# Patient Record
Sex: Female | Born: 1935 | Race: White | Hispanic: No | Smoking: Former smoker
Health system: Southern US, Community
[De-identification: ages and names within clinical notes are randomized; demographics above are authoritative.]

## PROBLEM LIST (undated history)

## (undated) DIAGNOSIS — I4891 Unspecified atrial fibrillation: Secondary | ICD-10-CM

## (undated) DIAGNOSIS — I5032 Chronic diastolic (congestive) heart failure: Secondary | ICD-10-CM

## (undated) DIAGNOSIS — I639 Cerebral infarction, unspecified: Secondary | ICD-10-CM

## (undated) DIAGNOSIS — N189 Chronic kidney disease, unspecified: Secondary | ICD-10-CM

## (undated) DIAGNOSIS — E663 Overweight: Secondary | ICD-10-CM

## (undated) DIAGNOSIS — J449 Chronic obstructive pulmonary disease, unspecified: Secondary | ICD-10-CM

## (undated) DIAGNOSIS — I10 Essential (primary) hypertension: Secondary | ICD-10-CM

## (undated) HISTORY — PX: BACK SURGERY: SHX140

## (undated) HISTORY — PX: CHOLECYSTECTOMY: SHX55

## (undated) HISTORY — DX: Cerebral infarction, unspecified: I63.9

## (undated) HISTORY — DX: Chronic obstructive pulmonary disease, unspecified: J44.9

## (undated) HISTORY — DX: Chronic kidney disease, unspecified: N18.9

## (undated) HISTORY — DX: Unspecified atrial fibrillation: I48.91

## (undated) HISTORY — PX: TOTAL ABDOMINAL HYSTERECTOMY W/ BILATERAL SALPINGOOPHORECTOMY: SHX83

## (undated) HISTORY — DX: Chronic diastolic (congestive) heart failure: I50.32

## (undated) HISTORY — DX: Overweight: E66.3

## (undated) HISTORY — DX: Essential (primary) hypertension: I10

---

## 2000-05-19 ENCOUNTER — Inpatient Hospital Stay (HOSPITAL_COMMUNITY): Admission: EM | Admit: 2000-05-19 | Discharge: 2000-05-23 | Payer: Self-pay | Admitting: Emergency Medicine

## 2000-05-23 ENCOUNTER — Inpatient Hospital Stay (HOSPITAL_COMMUNITY)
Admission: RE | Admit: 2000-05-23 | Discharge: 2000-05-27 | Payer: Self-pay | Admitting: Physical Medicine & Rehabilitation

## 2000-09-25 ENCOUNTER — Emergency Department (HOSPITAL_COMMUNITY): Admission: EM | Admit: 2000-09-25 | Discharge: 2000-09-25 | Payer: Self-pay | Admitting: Emergency Medicine

## 2006-06-24 ENCOUNTER — Ambulatory Visit: Payer: Self-pay | Admitting: Cardiology

## 2006-07-13 ENCOUNTER — Ambulatory Visit: Payer: Self-pay | Admitting: Physician Assistant

## 2007-08-14 ENCOUNTER — Encounter: Payer: Self-pay | Admitting: Cardiology

## 2007-10-23 ENCOUNTER — Ambulatory Visit: Payer: Self-pay | Admitting: Cardiology

## 2007-10-23 ENCOUNTER — Encounter: Payer: Self-pay | Admitting: Cardiology

## 2007-11-27 ENCOUNTER — Encounter: Payer: Self-pay | Admitting: Cardiology

## 2008-01-10 ENCOUNTER — Ambulatory Visit: Payer: Self-pay | Admitting: Cardiology

## 2008-02-27 ENCOUNTER — Ambulatory Visit: Payer: Self-pay | Admitting: Cardiology

## 2008-08-08 ENCOUNTER — Encounter: Payer: Self-pay | Admitting: Cardiology

## 2008-08-22 ENCOUNTER — Encounter: Payer: Self-pay | Admitting: Cardiology

## 2008-11-04 ENCOUNTER — Ambulatory Visit: Payer: Self-pay | Admitting: Cardiology

## 2008-11-04 ENCOUNTER — Encounter: Payer: Self-pay | Admitting: Physician Assistant

## 2008-12-23 DIAGNOSIS — E663 Overweight: Secondary | ICD-10-CM | POA: Insufficient documentation

## 2008-12-23 DIAGNOSIS — I10 Essential (primary) hypertension: Secondary | ICD-10-CM | POA: Insufficient documentation

## 2008-12-23 DIAGNOSIS — I5032 Chronic diastolic (congestive) heart failure: Secondary | ICD-10-CM

## 2008-12-23 DIAGNOSIS — I4891 Unspecified atrial fibrillation: Secondary | ICD-10-CM | POA: Insufficient documentation

## 2009-01-20 ENCOUNTER — Encounter: Payer: Self-pay | Admitting: Cardiology

## 2009-01-20 ENCOUNTER — Emergency Department (HOSPITAL_COMMUNITY): Admission: EM | Admit: 2009-01-20 | Discharge: 2009-01-20 | Payer: Self-pay | Admitting: Emergency Medicine

## 2009-04-10 ENCOUNTER — Encounter: Payer: Self-pay | Admitting: Cardiology

## 2009-06-09 ENCOUNTER — Ambulatory Visit: Payer: Self-pay | Admitting: Cardiology

## 2009-06-09 DIAGNOSIS — L659 Nonscarring hair loss, unspecified: Secondary | ICD-10-CM | POA: Insufficient documentation

## 2009-06-09 DIAGNOSIS — R609 Edema, unspecified: Secondary | ICD-10-CM

## 2009-06-20 ENCOUNTER — Encounter: Payer: Self-pay | Admitting: Cardiology

## 2009-06-22 ENCOUNTER — Encounter: Payer: Self-pay | Admitting: Cardiology

## 2009-06-24 ENCOUNTER — Encounter: Payer: Self-pay | Admitting: Cardiology

## 2009-06-25 ENCOUNTER — Encounter: Payer: Self-pay | Admitting: Cardiology

## 2009-07-29 ENCOUNTER — Encounter: Payer: Self-pay | Admitting: Cardiology

## 2009-10-06 ENCOUNTER — Ambulatory Visit: Payer: Self-pay | Admitting: Cardiology

## 2009-10-06 DIAGNOSIS — R0602 Shortness of breath: Secondary | ICD-10-CM

## 2009-10-06 DIAGNOSIS — I951 Orthostatic hypotension: Secondary | ICD-10-CM

## 2009-10-08 ENCOUNTER — Encounter: Payer: Self-pay | Admitting: Cardiology

## 2009-10-09 ENCOUNTER — Ambulatory Visit: Payer: Self-pay | Admitting: Cardiology

## 2009-10-16 ENCOUNTER — Telehealth (INDEPENDENT_AMBULATORY_CARE_PROVIDER_SITE_OTHER): Payer: Self-pay | Admitting: *Deleted

## 2009-10-22 ENCOUNTER — Telehealth (INDEPENDENT_AMBULATORY_CARE_PROVIDER_SITE_OTHER): Payer: Self-pay | Admitting: *Deleted

## 2009-10-30 ENCOUNTER — Ambulatory Visit: Payer: Self-pay | Admitting: Cardiology

## 2009-10-30 DIAGNOSIS — R5383 Other fatigue: Secondary | ICD-10-CM

## 2009-10-30 DIAGNOSIS — R5381 Other malaise: Secondary | ICD-10-CM | POA: Insufficient documentation

## 2009-10-31 ENCOUNTER — Encounter: Payer: Self-pay | Admitting: Cardiology

## 2009-11-01 ENCOUNTER — Encounter: Payer: Self-pay | Admitting: Cardiology

## 2009-11-07 ENCOUNTER — Encounter: Payer: Self-pay | Admitting: Cardiology

## 2009-11-07 ENCOUNTER — Encounter: Payer: Self-pay | Admitting: Physician Assistant

## 2009-11-08 ENCOUNTER — Ambulatory Visit: Payer: Self-pay | Admitting: Internal Medicine

## 2009-11-08 ENCOUNTER — Inpatient Hospital Stay (HOSPITAL_COMMUNITY): Admission: EM | Admit: 2009-11-08 | Discharge: 2009-11-20 | Payer: Self-pay | Admitting: Emergency Medicine

## 2009-11-08 ENCOUNTER — Ambulatory Visit: Payer: Self-pay | Admitting: Cardiology

## 2009-11-09 ENCOUNTER — Encounter: Payer: Self-pay | Admitting: Internal Medicine

## 2009-11-09 ENCOUNTER — Ambulatory Visit: Payer: Self-pay | Admitting: Vascular Surgery

## 2009-11-12 ENCOUNTER — Encounter (INDEPENDENT_AMBULATORY_CARE_PROVIDER_SITE_OTHER): Payer: Self-pay | Admitting: *Deleted

## 2010-02-26 ENCOUNTER — Ambulatory Visit: Payer: Self-pay | Admitting: Cardiology

## 2010-02-26 DIAGNOSIS — G603 Idiopathic progressive neuropathy: Secondary | ICD-10-CM | POA: Insufficient documentation

## 2010-03-02 ENCOUNTER — Encounter: Payer: Self-pay | Admitting: Cardiology

## 2010-03-03 ENCOUNTER — Encounter: Payer: Self-pay | Admitting: Cardiology

## 2010-03-06 ENCOUNTER — Encounter (INDEPENDENT_AMBULATORY_CARE_PROVIDER_SITE_OTHER): Payer: Self-pay | Admitting: *Deleted

## 2010-03-10 ENCOUNTER — Telehealth (INDEPENDENT_AMBULATORY_CARE_PROVIDER_SITE_OTHER): Payer: Self-pay | Admitting: *Deleted

## 2010-05-12 NOTE — Assessment & Plan Note (Signed)
Summary: 3 MO FU PER JUNE REMINDER-SRS   Visit Type:  Follow-up Primary Provider:  Dr. Cato Mulligan   History of Present Illness: the patient is a 77 year old female with history of permanent fibrillation. She has no significant coronary artery disease. She has a history of diastolic heart failure and a negative dobutamine stress echo in 1998. She also has chronic renal insufficiency. The patient reports significant dizziness with orthostatic symptoms.  The patient is questioning to be switched to dabigatran. However her creatinine is 1.7 with a GFR of 30 mL's per minute which would increase the risk of bleeding significantly on dabigatran do to its unpredictable anticoagulation level at this level of GFR. Also the patient has been stable her Coumadin with no adverse events.  The patient's main complaint is weakness and feeling like she got a fall when she gets up too quickly. She also complains of multiple aches and pains. According to her daughter she is very depressed because she felt very functional anymore.  Currently she also sometimes will follow along and is followed by Dr. Leandrew Koyanagi.  We did orthostatics in the office today and the patient was markedly orthostatic with a drop in her systolic blood pressure to 69 mmHg associated complaints of dizziness.  Preventive Screening-Counseling & Management  Alcohol-Tobacco     Smoking Status: quit     Year Quit: 1970  Current Medications (verified): 1)  Diltiazem Hcl Coated Beads 240 Mg Xr24h-Cap (Diltiazem Hcl Coated Beads) .... Take 1 Tablet By Mouth Once A Day 2)  Metoprolol Tartrate 50 Mg Tabs (Metoprolol Tartrate) .... 1/2 Tablet Twice A Day 3)  Trazodone Hcl 100 Mg Tabs (Trazodone Hcl) .... Take 1 Tablet By Mouth Once A Day 4)  Allopurinol 300 Mg Tabs (Allopurinol) .... Take 1 Tablet By Mouth Once A Day 5)  Potassium Chloride Cr 10 Meq Cr-Caps (Potassium Chloride) .... Take 1 Tablet By Mouth Two Times A Day 6)  Meclizine Hcl 25 Mg  Tabs (Meclizine Hcl) .... Take 1/2 To 1 Tablet Three Times A Day As Needed 7)  Meloxicam 7.5 Mg Tabs (Meloxicam) .... Take 1 Tablet By Mouth Two Times A Day 8)  Furosemide 40 Mg Tabs (Furosemide) .... Take 1 Tablet By Mouth Once A Day 9)  Warfarin Sodium 5 Mg Tabs (Warfarin Sodium) .... Use As Directed By Anticoagulation Clinic 10)  Diclofenac Sodium 75 Mg Tbec (Diclofenac Sodium) .... Take 1 Tablet By Mouth Two Times A Day As Needed 11)  Calcium Carbonate-Vitamin D 600-400 Mg-Unit Tabs (Calcium Carbonate-Vitamin D) .... Take 1 Tablet By Mouth Once A Day 12)  Percocet 5-325 Mg Tabs (Oxycodone-Acetaminophen) .... Take 1 Tablet By Mouth Two Times A Day As Needed  Allergies (verified): 1)  Prednisone (Prednisone) 2)  Sulfamethoxazole (Sulfamethoxazole)  Comments:  Nurse/Medical Assistant: The patient's medication bottles and allergies were reviewed with the patient and were updated in the Medication and Allergy Lists.  Past History:  Past Medical History: Last updated: 12/23/2008 HYPERTENSION, UNSPECIFIED (ICD-401.9) OVERWEIGHT/OBESITY (ICD-278.02) DIASTOLIC HEART FAILURE, CHRONIC (ICD-428.32) ATRIAL FIBRILLATION (ICD-427.31) COPD Chronic renal insufficiency History of stroke  Past Surgical History: Last updated: 12/23/2008 Back Surgery Cholecystectomy TAH and BSO  Family History: Last updated: 12/23/2008 Family History of Coronary Artery Disease:  Family History of CVA or Stroke:   Social History: Last updated: 12/23/2008 Single  Tobacco Use - Former.  Alcohol Use - no  Risk Factors: Smoking Status: quit (10/06/2009)  Social History: Smoking Status:  quit  Review of Systems  The patient complains of fatigue, shortness of breath, muscle weakness, and dizziness.  The patient denies malaise, fever, weight gain/loss, vision loss, decreased hearing, hoarseness, chest pain, palpitations, prolonged cough, wheezing, sleep apnea, coughing up blood, abdominal pain,  blood in stool, nausea, vomiting, diarrhea, heartburn, incontinence, blood in urine, joint pain, rash, skin lesions, headache, fainting, depression, anxiety, enlarged lymph nodes, easy bruising or bleeding, and environmental allergies.    Vital Signs:  Patient profile:   77 year old female Height:      67.5 inches Weight:      259 pounds Pulse rate:   62 / minute Pulse (ortho):   74 / minute BP sitting:   105 / 68  (left arm) BP standing:   91 / 65 Cuff size:   large  Vitals Entered By: Carlye Grippe (October 06, 2009 9:24 AM)  Serial Vital Signs/Assessments:  Time      Position  BP       Pulse  Resp  Temp     By 10:34 AM  Lying RA  108/71   73                    Lydia Anderson 10:34 AM  Sitting   62/35    64                    Lydia Anderson 10:34 AM  Standing  91/65    74                    Lydia Anderson 10:37 AM  Standing  111/69   80                    Lydia Anderson 10:40 AM  Standing  107/70   73                    Carlye Grippe   Physical Exam  Additional Exam:  General: Well-developed, well-nourished in no distress head: Normocephalic and atraumatic eyes PERRLA/EOMI intact, conjunctiva and lids normal nose: No deformity or lesions mouth normal dentition, normal posterior pharynx neck: Supple, no JVD.  No masses, thyromegaly or abnormal cervical nodes lungs: Normal breath sounds bilaterally without wheezing.  Normal percussion heart: irregular rate and rhythm with normal S1 and S2, no S3 or S4.  PMI is normal.  No pathological murmurs abdomen: Normal bowel sounds, abdomen is soft and nontender without masses, organomegaly or hernias noted.  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 I asked her to decrease Lasix to 40 mm p.o. q. daily and to discontinue Diovan.  Problem # 2:  COUMADIN THERAPY (ICD-V58.61) the patient should continue on Coumadin therapy. I do not think it's safe for her to switch to dabigatran with a GFR of 30 mL per minute.  Problem # 3:  DIASTOLIC HEART FAILURE, CHRONIC (ICD-428.32) the patient has no evidence of volume overload. Continue current medical therapy adjustment in Lasix because of orthostatic hypotension The following medications were removed from the medication list:    Diovan 160 Mg Tabs (Valsartan) .Marland Kitchen... Take one tablet by mouth daily Her updated medication list for  this problem includes:    Diltiazem Hcl Coated Beads 240 Mg Xr24h-cap (Diltiazem hcl coated beads) .Marland Kitchen... Take 1 tablet by mouth once a day    Metoprolol Tartrate 50 Mg Tabs (Metoprolol tartrate) .Marland Kitchen... 1/2 tablet twice a day    Furosemide 40 Mg Tabs (Furosemide) .Marland Kitchen... Take 1 tablet by mouth once a day    Warfarin Sodium 5 Mg Tabs (Warfarin sodium) ..... Use as directed by anticoagulation clinic  Problem # 4:  ATRIAL FIBRILLATION (ICD-427.31) chronic atrial fibrillation which appeared to be well rate controlled. Her updated medication list for this problem includes:    Metoprolol Tartrate 50 Mg Tabs (Metoprolol tartrate) .Marland Kitchen... 1/2 tablet twice a day    Warfarin Sodium 5 Mg Tabs (Warfarin sodium) ..... Use as directed by anticoagulation clinic  Other Orders: 2-D Echocardiogram (2D Echo)  Patient Instructions: 1)  2D Echo  2)  Stop Diovan 3)  Decrease Lasix to 40mg  daily 4)  Not a candidtate for Pradaxa due to elevated kidney function 5)  Follow up in  6 months

## 2010-05-12 NOTE — Assessment & Plan Note (Signed)
Summary: 6 MO FU PER JAN REMINDER-SRS   Visit Type:  Follow-up Primary Provider:  Dr. Cato Mulligan  CC:  follow-up visit.  History of Present Illness: the patient is a 77 year old female with a history of permanent atrial fibrillation. The patient has no significant coronary artery disease. She has a history from and diastolic heart failure and a negative dobutamine stress echo in 1998. She reportedly also has chronic renal insufficiency.  The patient's main complaint today is that she reports a swishing in her ear at various times particularly at night. She does not feel that her heart rate is irregular. She also has not taken her blood pressure and during that time. The patient also has switched doctors now to Dr. Leandrew Koyanagi. She has been complaining alopecia and brittle nails.she also reports increased lower extremity edema. She attributes her alopeciat due to the increased dose of Cardizem.  Procardia vascular perspective she denies however any chest pain shortness of breath orthopnea PND. She denies any syncope.  Clinical Review Panels:  CXR CXR results The cardiopericardial silhouette is enlarged.  Mild         interstitial changes appear chronic.  No focal airspace disease is         present.  The visualized soft tissues and bony thorax are         unremarkable.                   IMPRESSION:         1.  Cardiomegaly without failure.         2.  Stable appearance of chronic interstitial changes and COPD. (10/23/2007)  Echocardiogram Echocardiogram 1. The left ventricular chamber size is normal. Mild concentric left         ventricular hypertrophy is observed. There is normal left ventricular         systolic function. The estimated ejection fraction is 60-65%.          2. The left atrium is mild to moderately dilated. There is a trace of         mitral regurgitation.         3. The aortic valve is trileaflet. Mild aortic leaflet calcification         is visualized. Systolic  excursion of the aortic valve is normal. There         is aortic annular calcification. There is no evidence of aortic         regurgitation.         4. The right ventricle is mildly dilated. The right ventricular global         systolic function is normal.          5. There is mild tricuspid regurgitation.The right ventricular         systolic pressure is calculated at 41 mmHg.          6. There is a small pericardial effusion. (10/24/2007)    Preventive Screening-Counseling & Management  Alcohol-Tobacco     Smoking Status: quit > 6 months  Comments: Quit smoking 18-19 yrs ago after smoking for about 30 yrs  Current Medications (verified): 1)  Diltiazem Hcl Coated Beads 240 Mg Xr24h-Cap (Diltiazem Hcl Coated Beads) .... Take 1 Tablet By Mouth Once A Day 2)  Metoprolol Tartrate 50 Mg Tabs (Metoprolol Tartrate) .... 1/2 Tablet Twice A Day 3)  Trazodone Hcl 50 Mg Tabs (Trazodone Hcl) .... Take 1/2 To 1 Tablet By Mouth At Bedtime 4)  Allopurinol 300 Mg Tabs (Allopurinol) .... Take 1 Tablet By Mouth Once A Day 5)  Potassium Chloride Cr 10 Meq Cr-Caps (Potassium Chloride) .... Take One Tablet By Mouth Every Other Day 6)  Meclizine Hcl 25 Mg Tabs (Meclizine Hcl) .... Take 1/2 To 1 Tablet As Needed 7)  Meloxicam 7.5 Mg Tabs (Meloxicam) .... Take 1 Tablet By Mouth Two Times A Day 8)  Furosemide 40 Mg Tabs (Furosemide) .... Take 1 1/2 Tablet By Mouth Once Daily 9)  Warfarin Sodium 5 Mg Tabs (Warfarin Sodium) .... Use As Directed By Anticoagulation Clinic 10)  Diovan 160 Mg Tabs (Valsartan) .... Take One Tablet By Mouth Daily 11)  Diclofenac Sodium 75 Mg Tbec (Diclofenac Sodium) .... Take 1 Tablet By Mouth Two Times A Day As Needed 12)  Calcium Carbonate-Vitamin D 600-400 Mg-Unit Tabs (Calcium Carbonate-Vitamin D) .... Take 1 Tablet By Mouth Once A Day  Allergies: 1)  Prednisone (Prednisone) 2)  Sulfamethoxazole (Sulfamethoxazole)  Comments:  Nurse/Medical Assistant: The patient's  medications were reviewed with the patient and were updated in the Medication List. Pt brought medication bottles to office visit.  Cyril Loosen, RN, BSN (June 09, 2009 11:20 AM)  Past History:  Past Medical History: Last updated: 12/23/2008 HYPERTENSION, UNSPECIFIED (ICD-401.9) OVERWEIGHT/OBESITY (ICD-278.02) DIASTOLIC HEART FAILURE, CHRONIC (ICD-428.32) ATRIAL FIBRILLATION (ICD-427.31) COPD Chronic renal insufficiency History of stroke  Past Surgical History: Last updated: 12/23/2008 Back Surgery Cholecystectomy TAH and BSO  Family History: Last updated: 12/23/2008 Family History of Coronary Artery Disease:  Family History of CVA or Stroke:   Social History: Last updated: 12/23/2008 Single  Tobacco Use - Former.  Alcohol Use - no  Risk Factors: Smoking Status: quit > 6 months (06/09/2009)  Social History: Smoking Status:  quit > 6 months  Review of Systems       The patient complains of fatigue.  The patient denies malaise, fever, weight gain/loss, vision loss, decreased hearing, hoarseness, chest pain, palpitations, shortness of breath, prolonged cough, wheezing, sleep apnea, coughing up blood, abdominal pain, blood in stool, nausea, vomiting, diarrhea, heartburn, incontinence, blood in urine, muscle weakness, joint pain, leg swelling, rash, skin lesions, headache, fainting, dizziness, depression, anxiety, enlarged lymph nodes, easy bruising or bleeding, and environmental allergies.    Vital Signs:  Patient profile:   77 year old female Height:      67.5 inches Weight:      269 pounds BMI:     41.66 Pulse rate:   89 / minute BP sitting:   133 / 79  (left arm) Cuff size:   large  Vitals Entered By: Cyril Loosen, RN, BSN (June 09, 2009 11:07 AM)  Nutrition Counseling: Patient's BMI is greater than 25 and therefore counseled on weight management options. CC: follow-up visit Comments Pt states yesterday evening she got upset and can hear a whooshing  in her ear   Physical Exam  Additional Exam:  General: Well-developed, well-nourished in no distress head: Normocephalic and atraumatic eyes PERRLA/EOMI intact, conjunctiva and lids normal nose: No deformity or lesions mouth normal dentition, normal posterior pharynx neck: Supple, no JVD.  No masses, thyromegaly or abnormal cervical nodes lungs: Normal breath sounds bilaterally without wheezing.  Normal percussion heart: irregular rate and rhythm with normal S1 and S2, no S3 or S4.  PMI is normal.  No pathological murmurs abdomen: Normal bowel sounds, abdomen is soft and nontender without masses, organomegaly or hernias noted.  No hepatosplenomegaly musculoskeletal: Back normal, normal gait muscle strength and tone normal pulsus: Pulse is normal  in all 4 extremities Extremities:3+ peripheral pitting edema neurologic: Alert and oriented x 3 skin: Intact without lesions or rashes cervical nodes: No significant adenopathy psychologic: Normal affect    Impression & Recommendations:  Problem # 1:  ATRIAL FIBRILLATION (ICD-427.31) the patient is in permanent atrial fibrillation. Her heart rate is well controlled. She wants to decrease her diltiazem because she feels higher doses associated with alopecia. I changed the patient's diltiazem CD to 240 mg p.o. q. daily. I do not think this will cause worse rate control. Her updated medication list for this problem includes:    Metoprolol Tartrate 50 Mg Tabs (Metoprolol tartrate) .Marland Kitchen... 1/2 tablet twice a day    Warfarin Sodium 5 Mg Tabs (Warfarin sodium) ..... Use as directed by anticoagulation clinic  Orders: T-BNP  (B Natriuretic Peptide) [phone]) T-Basic Metabolic Panel ([id number]) T-TSH [phone])  Problem # 2:  DIASTOLIC HEART FAILURE, CHRONIC (ICD-428.32) there is potential evidence of volume overload with significant lower extremity edema. I checked a BNP level, although this is possible it is related to a calcium channel  blocker use. The following medications were removed from the medication list:    Diltiazem Hcl Coated Beads 300 Mg Xr24h-cap (Diltiazem hcl coated beads) .Marland Kitchen... Take 1 capsule by mouth once a day Her updated medication list for this problem includes:    Diltiazem Hcl Coated Beads 240 Mg Xr24h-cap (Diltiazem hcl coated beads) .Marland Kitchen... Take 1 tablet by mouth once a day    Metoprolol Tartrate 50 Mg Tabs (Metoprolol tartrate) .Marland Kitchen... 1/2 tablet twice a day    Furosemide 40 Mg Tabs (Furosemide) .Marland Kitchen... Take 1 1/2 tablet by mouth once daily    Warfarin Sodium 5 Mg Tabs (Warfarin sodium) ..... Use as directed by anticoagulation clinic    Diovan 160 Mg Tabs (Valsartan) .Marland Kitchen... Take one tablet by mouth daily  Orders: T-BNP  (B Natriuretic Peptide) [phone]) T-Basic Metabolic Panel ([id number]) T-TSH ([id number])  Problem # 3:  HYPERTENSION, UNSPECIFIED (ICD-401.9) Assessment: Improved  The following medications were removed from the medication list:    Diltiazem Hcl Coated Beads 300 Mg Xr24h-cap (Diltiazem hcl coated beads) .Marland Kitchen... Take 1 capsule by mouth once a day Her updated medication list for this problem includes:    Diltiazem Hcl Coated Beads 240 Mg Xr24h-cap (Diltiazem hcl coated beads) .Marland Kitchen... Take 1 tablet by mouth once a day    Metoprolol Tartrate 50 Mg Tabs (Metoprolol tartrate) .Marland Kitchen... 1/2 tablet twice a day    Furosemide 40 Mg Tabs (Furosemide) .Marland Kitchen... Take 1 1/2 tablet by mouth once daily    Diovan 160 Mg Tabs (Valsartan) .Marland Kitchen... Take one tablet by mouth daily  Orders: T-BNP  (B Natriuretic Peptide) [phone]) T-Basic Metabolic Panel ([id number]) T-TSH ([id number])  Problem # 4:  ALOPECIA (ICD-704.00) I told the patient the most likely culprit agent his Coumadin. We will also check her TSH. I decreased her dose of diltiazem. However this remains a problem I do think it is worthwhile to change her from Coumadin to dabigatran. We will further discuss the risk and benefits of this  medication change her next clinic visit.  Problem # 5:  EDEMA (ICD-782.3) the patient has significant lower extremity edema. This could be related to her calcium channel blocker. However suspect the patient has diastolic heart failure. We will check a BNP level and we may need to increase her Lasix. I also checked a BMET at the same time.  Patient Instructions: 1)  Labs today at [facility] 2)  Decrease Diltiazem CD to  240mg  daily Prescriptions: DILTIAZEM HCL COATED BEADS 240 MG XR24H-CAP (DILTIAZEM HCL COATED BEADS) Take 1 tablet by mouth once a day  #30 x 6   Entered by:   Hoover Brunette, LPN   Authorized by:   Lewayne Bunting, MD, [facility]   Signed by:   Hoover Brunette, LPN on 62/95/2841   Method used:   Electronically to        [facility] [address] (retail)       [address]       [place], [place]  [address]       Ph: [phone] or [phone]       Fax: [phone]   RxID:   [id number]

## 2010-05-12 NOTE — Miscellaneous (Signed)
Summary: Orders Update  Clinical Lists Changes  Orders: Added new Referral order of Neurology Referral (Neuro) - Signed 

## 2010-05-12 NOTE — Procedures (Signed)
Summary: Holter and Event/ [facility] END OF SERVICE SUMMARY REPORT  Holter and Event/ [facility] END OF SERVICE SUMMARY REPORT   Imported By: Dorise Hiss 06/06/2009 15:03:11  _____________________________________________________________________  External Attachment:    Type:   Image     Comment:   External Document

## 2010-05-12 NOTE — Assessment & Plan Note (Signed)
Summary: return office visit  --agh   Visit Type:  hospital follow-up Primary Provider:  Dr. Cato Mulligan   History of Present Illness: the patient is a 77 year old female with history of permanent fibrillation. She has no significant coronary artery disease. She has a history of diastolic heart failure and a negative dobutamine stress echo in 1998. She also has chronic renal insufficiency. The patient reports significant dizziness with orthostatic symptoms.  The patient is questioning to be switched to dabigatran. However her creatinine is 1.7 with a GFR of 30 mL's per minute which would increase the risk of bleeding significantly on dabigatran do to its unpredictable anticoagulation level at this level of GFR. Also the patient has been stable her Coumadin with no adverse events.  The patient's main complaint is weakness and feeling like she got a fall when she gets up too quickly. She also complains of multiple aches and pains. According to her daughter she is very depressed because she felt very functional anymore.  The patient was hospitalized for her sudden hypotension associated with fall. We decreased her Lasix at that time. Plans wereto further evaluate her with aldosterone, cortisolso and serum norepinephrine levels. Unfortunately she was not done in the hospital. The patient now reports increased weakness. I tried to understand and she has no strength in her lower extremities. She did unable to do any type of squatting without assistance. She also reports when pains in both knees. She spending most of the time in a wheelchair. Her daughter is very concerned and feels that her mother has deteriorated rapidly in the last couple months.  Preventive Screening-Counseling & Management  Alcohol-Tobacco     Smoking Status: quit     Year Quit: 1970  Current Medications (verified): 1)  Diltiazem Hcl Coated Beads 240 Mg Xr24h-Cap (Diltiazem Hcl Coated Beads) .... Take 1 Tablet By Mouth Once A  Day 2)  Metoprolol Tartrate 50 Mg Tabs (Metoprolol Tartrate) .... 1/2 Tablet Twice A Day 3)  Trazodone Hcl 100 Mg Tabs (Trazodone Hcl) .... Take 1 Tablet By Mouth Once A Day 4)  Allopurinol 300 Mg Tabs (Allopurinol) .... Take 1 Tablet By Mouth Once A Day 5)  Potassium Chloride Cr 10 Meq Cr-Caps (Potassium Chloride) .... Take One By Mouth Every Other Day 6)  Meclizine Hcl 25 Mg Tabs (Meclizine Hcl) .... Take 1 Tablet Three Times A Day As Needed 7)  Coumadin 10 Mg Tabs (Warfarin Sodium) .... Take 1 Tablet By Mouth Once A Day 8)  Oscal 500/200 D-3 500-200 Mg-Unit Tabs (Calcium-Vitamin D) .... Take 1 Tablet By Mouth Two Times A Day 9)  Macrobid 100 Mg Caps (Nitrofurantoin Monohyd Macro) .... Take 1 Tablet By Mouth Two Times A Day X's 7 Days 10)  Robitussin Dm 100-10 Mg/76ml Syrp (Dextromethorphan-Guaifenesin) .... 5cc By Mouth Everys 6 Hours 11)  Fludrocortisone Acetate 0.1 Mg Tabs (Fludrocortisone Acetate) .... Take 1 Tablet By Mouth Once A Day 12)  Multivitamins  Tabs (Multiple Vitamin) .... Take 1 Tablet By Mouth Once A Day 13)  Vitamin D3 1000 Unit Tabs (Cholecalciferol) .... Take 1 Tablet By Mouth Once A Day 14)  Topamax 25 Mg Tabs (Topiramate) .... Take 1 Tablet By Mouth Two Times A Day 15)  Prozac 20 Mg Caps (Fluoxetine Hcl) .... Take 1 Tablet By Mouth Once A Day 16)  Hydrocodone-Acetaminophen 5-325 Mg Tabs (Hydrocodone-Acetaminophen) .... Take 1 Tablet By Mouth Three Times A Day As Needed  Allergies (verified): 1)  Prednisone (Prednisone) 2)  Sulfamethoxazole (Sulfamethoxazole)  Comments:  Nurse/Medical Assistant: The patient's medication list and allergies were reviewed with the patient and were updated in the Medication and Allergy Lists.   Past History:  Past Medical History: Last updated: 12/23/2008 HYPERTENSION, UNSPECIFIED (ICD-401.9) OVERWEIGHT/OBESITY (ICD-278.02) DIASTOLIC HEART FAILURE, CHRONIC (ICD-428.32) ATRIAL FIBRILLATION (ICD-427.31) COPD Chronic renal  insufficiency History of stroke  Past Surgical History: Last updated: 12/23/2008 Back Surgery Cholecystectomy TAH and BSO  Family History: Last updated: 12/23/2008 Family History of Coronary Artery Disease:  Family History of CVA or Stroke:   Social History: Last updated: 12/23/2008 Single  Tobacco Use - Former.  Alcohol Use - no  Risk Factors: Smoking Status: quit (10/30/2009)  Review of Systems       The patient complains of fatigue, weight gain/loss, muscle weakness, joint pain, and depression.  The patient denies malaise, fever, vision loss, decreased hearing, hoarseness, chest pain, palpitations, shortness of breath, prolonged cough, wheezing, sleep apnea, coughing up blood, abdominal pain, blood in stool, nausea, vomiting, diarrhea, heartburn, incontinence, blood in urine, leg swelling, rash, skin lesions, headache, fainting, dizziness, anxiety, enlarged lymph nodes, easy bruising or bleeding, and environmental allergies.    Vital Signs:  Patient profile:   77 year old female Height:      67.5 inches Weight:      259 pounds Pulse rate:   75 / minute BP sitting:   118 / 74  (right arm) Cuff size:   large  Vitals Entered By: Carlye Grippe (October 30, 2009 2:51 PM)  Physical Exam  Additional Exam:  General: Well-developed, well-nourished in no distress head: Normocephalic and atraumatic eyes PERRLA/EOMI intact, conjunctiva and lids normal nose: No deformity or lesions mouth normal dentition, normal posterior pharynx neck: Supple, no JVD.  No masses, thyromegaly or abnormal cervical nodes lungs: Normal breath sounds bilaterally without wheezing.  Normal percussion heart: irregular rate and rhythm with normal S1 and S2, no S3 or S4.  PMI is normal.  No pathological murmurs abdomen: Normal bowel sounds, abdomen is soft and nontender without masses, organomegaly or hernias noted.  No hepatosplenomegaly musculoskeletal: Back normal, normal gait muscle strength and tone  normal pulsus: Pulse is normal in all 4 extremities Extremities:3+ peripheral pitting edema neurologic: Alert and oriented x 3, markedly decreased range of motion extremities. Patient is unable to squat without assistance. skin: Intact without lesions or rashes cervical nodes: No significant adenopathy psychologic: Normal affect    Impression & Recommendations:  Problem # 1:  POSTURAL HYPOTENSION (ICD-458.0) the patient is currently not orthostatic. Will continue on her current medical regimen. It appears that her symptoms are more related to generalized weakness and loss of strength in the lower extremities. I did order a posterior on, 24 hour cortisol and serum norepinephrine levels as well as dopaminebeta hydroxylase level. . I still concerned the patient could have possible Shy-Drager syndrome.  Problem # 2:  WEAKNESS (ICD-780.79) the patient has significant loss of strength in the lower extremities patella and the proximal muscles but also in the proximal muscles of her upper extremities. I will check a sedimentation rate and a high-sensitivity CRP. She possibly could have polymyalgia rheumatica. She needs a referral to rheumatology. I also ordered EMGs and nerve conduction velocities of the lower extremities.  Problem # 3:  COUMADIN THERAPY (ICD-V58.61) Assessment: Comment Only  Problem # 4:  ATRIAL FIBRILLATION (ICD-427.31) Assessment: Comment Only  Her updated medication list for this problem includes:    Metoprolol Tartrate 50 Mg Tabs (Metoprolol tartrate) .Marland Kitchen... 1/2 tablet twice a day  Coumadin 10 Mg Tabs (Warfarin sodium) .Marland Kitchen... Take 1 tablet by mouth once a day  Patient Instructions: 1)  Labs:  sed rate, hs-crp, serum aldosterone, 24 hour urine cortisol, serum norepinephrine, serum dopamine beta hydroxylase level, and plasma renin activity - all labs need to be drawn in the a.m. 2)  EMG/NCV - will send referral to Dr. Ninetta Lights for this 3)  Follow up in  8 weeks - 9/22 at  1:45, Thursday

## 2010-05-12 NOTE — Letter (Signed)
Summary: External Correspondence/ FAXED REFERRAL [facility]  External Correspondence/ FAXED REFERRAL [facility]   Imported By: Dorise Hiss 11/12/2009 16:41:19  _____________________________________________________________________  External Attachment:    Type:   Image     Comment:   External Document

## 2010-05-12 NOTE — Consult Note (Signed)
Summary: Consultation Report  Consultation Report   Imported By: Dorise Hiss 10/10/2009 11:04:35  _____________________________________________________________________  External Attachment:    Type:   Image     Comment:   External Document

## 2010-05-12 NOTE — Progress Notes (Signed)
Summary: Pt's dgt has questions  Phone Note Call from Patient Call back at [facility] Phone [phone] Call back at [phone]   Summary of Call: Pt's dgt, Ladona Horns, called regarding pt. She states when pt was in the hospital Dr. Earnestine Leys told her they would like what was wrong with her before she was discharged. She states she was never told while she was in the hospital what was going on. She states they recently saw Dr. Leandrew Koyanagi (primary MD) and he told them that all of her tests were negative. She states he could not tell them anything that was wrong other than something about some kind of dysfunctional kidney disease. She states wants to know what has caused her mother's symptoms. She states if she doesn't find out something, she is going to have her mother moved to [facility] hospital.  Initial call taken by: Cyril Loosen, RN, BSN,  October 22, 2009 2:29 PM  Follow-up for Phone Call        Please give a copy of my hospital consult note to daughter. It is carefully explained what is going on.I have ordered multiple labs at that time and nobody followed up because I went on vacation. I suggest thst patient sees me next week with her daughter so things be carefully discussed. If she wants she can go to [facility] but she had a very thorougjh w/u unfortunately nobody f/u . Please scan in all bloodwork from last hospitalization for my review.  Follow-up by: Lewayne Bunting, MD, [facility],  October 24, 2009 2:34 PM  Additional Follow-up for Phone Call Additional follow up Details #1::        Scheduled OV for 7/21 at 2:30. Hoover Brunette, LPN  October 24, 2009 5:17 PM

## 2010-05-12 NOTE — Letter (Signed)
Summary: External Correspondence/ FAXED DR. Corliss Skains  External Correspondence/ FAXED DR. Corliss Skains   Imported By: Dorise Hiss 11/12/2009 16:44:09  _____________________________________________________________________  External Attachment:    Type:   Image     Comment:   External Document

## 2010-05-12 NOTE — Miscellaneous (Signed)
Summary: Orders Update - Neurology referral  Clinical Lists Changes  Orders: Added new Referral order of Neurology Referral (Neuro) - Signed 

## 2010-05-12 NOTE — Letter (Signed)
Summary: Engineer, materials at [facility]  [address]   [place], [place] [address]   Phone: [phone]  Fax: [phone]        March 06, 2010 MRN: [id number]   Shelia Thompson [address] [place], [place]  [address]   Dear Ms. Donaghue,  Your test ordered by Selena Batten has been reviewed by your physician (or physician assistant) and was found to be normal or stable. Your physician (or physician assistant) felt no changes were needed at this time.  ____ Echocardiogram  ____ Cardiac Stress Test  __X__ Lab Work - muscle enzyme test normal - no evidence of muscle disease caused by medications & CPK also normal - no evidence of muscle abnormality  ____ Peripheral vascular study of arms, legs or neck  ____ CT scan or X-ray  ____ Lung or Breathing test  ____ Other:   Thank you.   Hoover Brunette, LPN    Duane Boston, M.D., F.A.C.C. Thressa Sheller, M.D., F.A.C.C. Oneal Grout, M.D., F.A.C.C. Cheree Ditto, M.D., F.A.C.C. Daiva Nakayama, M.D., F.A.C.C. Kenney Houseman, M.D., F.A.C.C. Jeanne Ivan, PA-C

## 2010-05-12 NOTE — Miscellaneous (Signed)
Summary: Nursing Home/ ORDERS  Nursing Home/ ORDERS   Imported By: Dorise Hiss 11/07/2009 15:15:37  _____________________________________________________________________  External Attachment:    Type:   Image     Comment:   External Document

## 2010-05-12 NOTE — Letter (Signed)
Summary: External Correspondence/ REFERRAL REQUEST [facility]  External Correspondence/ REFERRAL REQUEST [facility]   Imported By: Dorise Hiss 03/11/2010 10:44:09  _____________________________________________________________________  External Attachment:    Type:   Image     Comment:   External Document

## 2010-05-12 NOTE — Progress Notes (Signed)
Summary: Pending Echo  Phone Note Outgoing Call   Call placed by: Cyril Loosen, RN, BSN,  October 16, 2009 9:11 AM Summary of Call: Attempted to reach pt regarding Echo that was scheduled for 10/10/09 but does not appear to have been done. No answer, no machine.  Initial call taken by: Cyril Loosen, RN, BSN,  October 16, 2009 9:12 AM  Follow-up for Phone Call        Pt had Echo scheduled following 6/27 office visit. It appears pt was admitted to [facility] 6/29-10/16/09. It also appears she is now a resident at [facility]. Would you still like Echo ordered? Follow-up by: Cyril Loosen, RN, BSN,  October 21, 2009 10:33 AM  Additional Follow-up for Phone Call Additional follow up Details #1::        No would hold off.  Additional Follow-up by: Lewayne Bunting, MD, [facility],  October 21, 2009 4:15 PM

## 2010-05-12 NOTE — Miscellaneous (Signed)
Summary: Orders Update - Rheumatology referral   Clinical Lists Changes  Orders: Added new Referral order of Misc. Referral (Misc. Ref) - Signed

## 2010-05-12 NOTE — Letter (Signed)
Summary: [facility] D/C DR. Leandrew Koyanagi  [facility] D/C DR. Leandrew Koyanagi   Imported By: Zachary George 10/06/2009 09:12:36  _____________________________________________________________________  External Attachment:    Type:   Image     Comment:   External Document

## 2010-05-12 NOTE — Letter (Signed)
Summary: Engineer, materials at [facility]  [address]   [place], [place] [address]   Phone: [phone]  Fax: [phone]        November 12, 2009 MRN: [id number]   TAUNYA GORAL [address] [place], [place]  [address]   Dear Ms. Elks,  Your test ordered by Selena Batten has been reviewed by your physician (or physician assistant) and was found to be normal or stable. Your physician (or physician assistant) felt no changes were needed at this time.  ____ Echocardiogram  ____ Cardiac Stress Test  __X__ Lab Work - cortisol & other labs stable  ____ Peripheral vascular study of arms, legs or neck  ____ CT scan or X-ray  ____ Lung or Breathing test  ____ Other:   Thank you.   Hoover Brunette, LPN    Duane Boston, M.D., F.A.C.C. Thressa Sheller, M.D., F.A.C.C. Oneal Grout, M.D., F.A.C.C. Cheree Ditto, M.D., F.A.C.C. Daiva Nakayama, M.D., F.A.C.C. Kenney Houseman, M.D., F.A.C.C. Jeanne Ivan, PA-C

## 2010-05-12 NOTE — Assessment & Plan Note (Signed)
Summary: 6 MO FUL   Visit Type:  Follow-up Primary Lillyth Spong:  Dr. Cato Mulligan   History of Present Illness: the patient is a 20 her old female with history of permanent atrial fibrillation. She has no significant coronary artery disease. She'll history of diastolic heart failure and negative dobutamine stress echo in 1998. She has chronic renal insufficiency. She has a history of orthostasis. The patient is on Coumadin for stroke prophylaxis. Next   The patient was recently admitted to [facility] for right-sided weakness and right-sided tremors. Both neurology and neurosurgery was involved and there was no obvious cause found for his problem.the patient had MRI of the brain without contrast which showed no focal abnormalities. An MR of his C-spine showed multilevel cervical degenerative changes most prominent at C5-C6 where there is a large right-sided osteophyte exerting mass effect on spinal cord. However Dr. Lovell Sheehan did not feel this was the cause of the patient's symptoms. An echocardiogram was also performed and was essentially within normal limits. Can't stand lower extremity. Physical therapy helped a little. Then regressed. Physical therapy at home.EMG/NCV scheduling. ?paraneoplastic process.  Preventive Screening-Counseling & Management  Alcohol-Tobacco     Smoking Status: quit     Year Quit: 1970  Current Medications (verified): 1)  Diltiazem Hcl Coated Beads 240 Mg Xr24h-Cap (Diltiazem Hcl Coated Beads) .... Take 1 Tablet By Mouth Once A Day 2)  Metoprolol Tartrate 50 Mg Tabs (Metoprolol Tartrate) .... Take 1 Tablet By Mouth Two Times A Day 3)  Trazodone Hcl 100 Mg Tabs (Trazodone Hcl) .... Take 1 Tablet By Mouth Once A Day 4)  Allopurinol 300 Mg Tabs (Allopurinol) .... Take 1 Tablet By Mouth Once A Day 5)  Potassium Chloride Cr 10 Meq Cr-Caps (Potassium Chloride) .... Take 1 Tablet By Mouth Two Times A Day 6)  Warfarin Sodium 5 Mg Tabs (Warfarin Sodium) .... Use As  Directed 7)  Caltrate 600+d Plus 600-400 Mg-Unit Tabs (Calcium Carbonate-Vit D-Min) .... Take 1 Tablet By Mouth Two Times A Day 8)  Multivitamins  Tabs (Multiple Vitamin) .... Take 1 Tablet By Mouth Once A Day 9)  Percocet 10-325 Mg Tabs (Oxycodone-Acetaminophen) .... Take 1 Tablet By Mouth Three Times A Day As Needed 10)  Omeprazole 20 Mg Cpdr (Omeprazole) .... Take 1 Tablet By Mouth Once A Day 11)  Furosemide 40 Mg Tabs (Furosemide) .... Take 1 Tablet By Mouth Two Times A Day 12)  Alprazolam 0.25 Mg Tabs (Alprazolam) .... Take 1 Tablet By Mouth Two Times A Day As Needed 13)  Nizoral 2 % Sham (Ketoconazole) .... Apply Two Times A Day To Affected Areas  Allergies (verified): 1)  Prednisone (Prednisone) 2)  Sulfamethoxazole (Sulfamethoxazole)  Comments:  Nurse/Medical Assistant: The patient's medication bottles and allergies were reviewed with the patient and were updated in the Medication and Allergy Lists.  Past History:  Past Medical History: Last updated: 12/23/2008 HYPERTENSION, UNSPECIFIED (ICD-401.9) OVERWEIGHT/OBESITY (ICD-278.02) DIASTOLIC HEART FAILURE, CHRONIC (ICD-428.32) ATRIAL FIBRILLATION (ICD-427.31) COPD Chronic renal insufficiency History of stroke  Past Surgical History: Last updated: 12/23/2008 Back Surgery Cholecystectomy TAH and BSO  Family History: Last updated: 12/23/2008 Family History of Coronary Artery Disease:  Family History of CVA or Stroke:   Social History: Last updated: 12/23/2008 Single  Tobacco Use - Former.  Alcohol Use - no  Risk Factors: Smoking Status: quit (02/26/2010)  Vital Signs:  Patient profile:   77 year old female Height:      67.5 inches Weight:      254  pounds Pulse rate:   80 / minute BP sitting:   146 / 81  (left arm) Cuff size:   large  Vitals Entered By: Carlye Grippe (February 26, 2010 1:29 PM)   Physical Exam  Additional Exam:  General: Well-developed, well-nourished in no distress head:  Normocephalic and atraumatic eyes PERRLA/EOMI intact, conjunctiva and lids normal nose: No deformity or lesions mouth normal dentition, normal posterior pharynx neck: Supple, no JVD.  No masses, thyromegaly or abnormal cervical nodes lungs: Normal breath sounds bilaterally without wheezing.  Normal percussion heart: irregular rate and rhythm with normal S1 and S2, no S3 or S4.  PMI is normal.  No pathological murmurs abdomen: Normal bowel sounds, abdomen is soft and nontender without masses, organomegaly or hernias noted.  No hepatosplenomegaly musculoskeletal: Back normal, normal gait muscle strength and tone normal pulsus: Pulse is normal in all 4 extremities Extremities:3+ peripheral pitting edema neurologic: Alert and oriented x 3, markedly decreased range of motion extremities. Patient is unable to squat without assistance. 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 We will rule her out for a myopathy and also order EMGs and nerve conduction velocities. The patient does have a large right-sided osteophyte exerting mass effect on the spinal cord however according to the neurosurgeon this is not likely the explanation for her muscle strength in the lower extremities Orders: T-CK Total [phone]) T- * Misc. Laboratory test [phone])  Problem # 2:  ATRIAL FIBRILLATION (ICD-427.31) patient is in permanent atrial fibrillation. Her rate is well controlled. She will continue on Coumadin Her updated medication list for this problem includes:    Metoprolol Tartrate 50 Mg Tabs (Metoprolol tartrate) .Marland Kitchen... Take 1 tablet by mouth two times a day    Warfarin Sodium 5 Mg Tabs (Warfarin sodium) ..... Use as directed  Problem # 3:  DIASTOLIC HEART FAILURE, CHRONIC (ICD-428.32) the patient is some  diastolic heart failure but she does not appear to be volume overloaded currently. Her updated medication list for this problem includes:    Diltiazem Hcl Coated Beads 240 Mg Xr24h-cap (Diltiazem hcl coated beads) .Marland Kitchen... Take 1 tablet by mouth once a day    Metoprolol Tartrate 50 Mg Tabs (Metoprolol tartrate) .Marland Kitchen... Take 1 tablet by mouth two times a day    Warfarin Sodium 5 Mg Tabs (Warfarin sodium) ..... Use as directed    Furosemide 40 Mg Tabs (Furosemide) .Marland Kitchen... Take 1 tablet by mouth two times a day  Patient Instructions: 1)  Nizoral 2% shampoo 2)  Referral to Dr. Ninetta Lights for EMG/NCV 3)  Labs:  CK, aldoolase 4)  Follow up in  3 months Prescriptions: NIZORAL 2 % SHAM (KETOCONAZOLE) apply two times a day to affected areas  #1 x 1   Entered by:   Hoover Brunette, LPN   Authorized by:   Lewayne Bunting, MD, [facility]   Signed by:   Hoover Brunette, LPN on 91/47/8295   Method used:   Electronically to        [facility] [address] (retail)       [address]       [place], [place]  [address]       Ph: [phone] or [phone]       Fax: [phone]   RxID:   [id number]

## 2010-05-12 NOTE — Progress Notes (Signed)
Summary: LAB RESULTS  Phone Note Other Incoming Call back at [phone]   Caller: Ladona Horns Daughter Reason for Call: Discuss lab or test results Summary of Call: Would like lab results from 11-17/11 Initial call taken by: Dorise Hiss,  March 10, 2010 3:29 PM  Follow-up for Phone Call        Daughter Rosey Bath) notified.  Mailed letter.  Normal results.     Follow-up by: Hoover Brunette, LPN,  March 11, 2010 4:09 PM

## 2010-06-26 LAB — PROTIME-INR
INR: 2.26 — ABNORMAL HIGH (ref 0.00–1.49)
INR: 2.38 — ABNORMAL HIGH (ref 0.00–1.49)
INR: 2.38 — ABNORMAL HIGH (ref 0.00–1.49)
INR: 2.47 — ABNORMAL HIGH (ref 0.00–1.49)
INR: 2.67 — ABNORMAL HIGH (ref 0.00–1.49)
Prothrombin Time: 25.4 seconds — ABNORMAL HIGH (ref 11.6–15.2)
Prothrombin Time: 25.9 seconds — ABNORMAL HIGH (ref 11.6–15.2)
Prothrombin Time: 26.1 seconds — ABNORMAL HIGH (ref 11.6–15.2)
Prothrombin Time: 26.1 seconds — ABNORMAL HIGH (ref 11.6–15.2)
Prothrombin Time: 26.9 seconds — ABNORMAL HIGH (ref 11.6–15.2)
Prothrombin Time: 28.5 seconds — ABNORMAL HIGH (ref 11.6–15.2)

## 2010-06-26 LAB — URINALYSIS, ROUTINE W REFLEX MICROSCOPIC
Bilirubin Urine: NEGATIVE
Glucose, UA: NEGATIVE mg/dL
Protein, ur: NEGATIVE mg/dL
Urobilinogen, UA: 1 mg/dL (ref 0.0–1.0)

## 2010-06-26 LAB — CBC
HCT: 29.1 % — ABNORMAL LOW (ref 36.0–46.0)
HCT: 31.7 % — ABNORMAL LOW (ref 36.0–46.0)
Hemoglobin: 10.7 g/dL — ABNORMAL LOW (ref 12.0–15.0)
MCH: 31.6 pg (ref 26.0–34.0)
MCHC: 33.1 g/dL (ref 30.0–36.0)
MCHC: 33.4 g/dL (ref 30.0–36.0)
MCHC: 34.4 g/dL (ref 30.0–36.0)
MCHC: 35.3 g/dL (ref 30.0–36.0)
MCV: 94.9 fL (ref 78.0–100.0)
Platelets: 233 10*3/uL (ref 150–400)
Platelets: 262 10*3/uL (ref 150–400)
Platelets: 269 10*3/uL (ref 150–400)
Platelets: 271 10*3/uL (ref 150–400)
RBC: 3.06 MIL/uL — ABNORMAL LOW (ref 3.87–5.11)
RDW: 17.6 % — ABNORMAL HIGH (ref 11.5–15.5)
RDW: 18.7 % — ABNORMAL HIGH (ref 11.5–15.5)
WBC: 9.3 10*3/uL (ref 4.0–10.5)

## 2010-06-26 LAB — BASIC METABOLIC PANEL
BUN: 16 mg/dL (ref 6–23)
BUN: 17 mg/dL (ref 6–23)
BUN: 19 mg/dL (ref 6–23)
CO2: 25 mEq/L (ref 19–32)
CO2: 26 mEq/L (ref 19–32)
CO2: 28 mEq/L (ref 19–32)
Calcium: 9.3 mg/dL (ref 8.4–10.5)
Calcium: 9.3 mg/dL (ref 8.4–10.5)
Chloride: 102 mEq/L (ref 96–112)
Chloride: 104 mEq/L (ref 96–112)
Creatinine, Ser: 1.02 mg/dL (ref 0.4–1.2)
Creatinine, Ser: 1.1 mg/dL (ref 0.4–1.2)
Creatinine, Ser: 1.25 mg/dL — ABNORMAL HIGH (ref 0.4–1.2)
GFR calc Af Amer: >60 mL/min (ref >60)
GFR calc non Af Amer: 41 mL/min — ABNORMAL LOW (ref >60)
GFR calc non Af Amer: 47 mL/min — ABNORMAL LOW (ref >60)
Glucose, Bld: 117 mg/dL — ABNORMAL HIGH (ref 70–99)
Glucose, Bld: 123 mg/dL — ABNORMAL HIGH (ref 70–99)
Glucose, Bld: 131 mg/dL — ABNORMAL HIGH (ref 70–99)
Glucose, Bld: 140 mg/dL — ABNORMAL HIGH (ref 70–99)
Potassium: 4 mEq/L (ref 3.5–5.1)
Potassium: 4.1 mEq/L (ref 3.5–5.1)

## 2010-06-26 LAB — URINE CULTURE: Colony Count: >=100000

## 2010-06-26 LAB — DIFFERENTIAL
Basophils Absolute: 0 10*3/uL (ref 0.0–0.1)
Basophils Relative: 0 % (ref 0–1)
Lymphocytes Relative: 18 % (ref 12–46)
Neutro Abs: 6.9 10*3/uL (ref 1.7–7.7)
Neutrophils Relative %: 74 % (ref 43–77)

## 2010-06-26 LAB — URINE MICROSCOPIC-ADD ON

## 2010-06-26 LAB — VITAMIN D 1,25 DIHYDROXY
Vitamin D2 1, 25 (OH)2: <8 pg/mL
Vitamin D3 1, 25 (OH)2: 54 pg/mL

## 2010-06-27 LAB — URINE CULTURE
Colony Count: 65000
Culture  Setup Time: 201107301709

## 2010-06-27 LAB — T4, FREE: Free T4: 1.42 ng/dL (ref 0.80–1.80)

## 2010-06-27 LAB — COMPREHENSIVE METABOLIC PANEL
Albumin: 3.4 g/dL — ABNORMAL LOW (ref 3.5–5.2)
Alkaline Phosphatase: 63 U/L (ref 39–117)
BUN: 15 mg/dL (ref 6–23)
CO2: 27 mEq/L (ref 19–32)
Chloride: 103 mEq/L (ref 96–112)
GFR calc non Af Amer: 52 mL/min — ABNORMAL LOW (ref >60)
Glucose, Bld: 114 mg/dL — ABNORMAL HIGH (ref 70–99)
Potassium: 3.9 mEq/L (ref 3.5–5.1)
Total Bilirubin: 0.9 mg/dL (ref 0.3–1.2)

## 2010-06-27 LAB — CBC
HCT: 28.6 % — ABNORMAL LOW (ref 36.0–46.0)
HCT: 32.7 % — ABNORMAL LOW (ref 36.0–46.0)
Hemoglobin: 10.1 g/dL — ABNORMAL LOW (ref 12.0–15.0)
MCHC: 34.7 g/dL (ref 30.0–36.0)
MCHC: 35.3 g/dL (ref 30.0–36.0)
MCV: 94.9 fL (ref 78.0–100.0)
Platelets: 242 10*3/uL (ref 150–400)
RDW: 18.3 % — ABNORMAL HIGH (ref 11.5–15.5)
WBC: 10.2 10*3/uL (ref 4.0–10.5)

## 2010-06-27 LAB — HEMOGLOBIN A1C
Hgb A1c MFr Bld: 4.4 % — ABNORMAL LOW (ref <5.7)
Mean Plasma Glucose: 80 mg/dL (ref <117)

## 2010-06-27 LAB — PROTIME-INR
INR: 2.03 — ABNORMAL HIGH (ref 0.00–1.49)
Prothrombin Time: 22.8 seconds — ABNORMAL HIGH (ref 11.6–15.2)
Prothrombin Time: 23.1 seconds — ABNORMAL HIGH (ref 11.6–15.2)

## 2010-06-27 LAB — DIFFERENTIAL
Basophils Absolute: 0 10*3/uL (ref 0.0–0.1)
Basophils Relative: 0 % (ref 0–1)
Basophils Relative: 0 % (ref 0–1)
Eosinophils Relative: 1 % (ref 0–5)
Eosinophils Relative: 2 % (ref 0–5)
Lymphocytes Relative: 14 % (ref 12–46)
Lymphocytes Relative: 8 % — ABNORMAL LOW (ref 12–46)
Monocytes Absolute: 0.7 10*3/uL (ref 0.1–1.0)
Monocytes Absolute: 0.8 10*3/uL (ref 0.1–1.0)
Monocytes Relative: 9 % (ref 3–12)
Neutro Abs: 6.1 10*3/uL (ref 1.7–7.7)
Neutro Abs: 8.5 10*3/uL — ABNORMAL HIGH (ref 1.7–7.7)

## 2010-06-27 LAB — URINALYSIS, ROUTINE W REFLEX MICROSCOPIC
Glucose, UA: NEGATIVE mg/dL
Hgb urine dipstick: NEGATIVE
Ketones, ur: NEGATIVE mg/dL
Protein, ur: NEGATIVE mg/dL
Urobilinogen, UA: 0.2 mg/dL (ref 0.0–1.0)

## 2010-06-27 LAB — BASIC METABOLIC PANEL
BUN: 16 mg/dL (ref 6–23)
BUN: 18 mg/dL (ref 6–23)
Chloride: 102 mEq/L (ref 96–112)
Creatinine, Ser: 1.02 mg/dL (ref 0.4–1.2)
GFR calc non Af Amer: 50 mL/min — ABNORMAL LOW (ref >60)
GFR calc non Af Amer: 52 mL/min — ABNORMAL LOW (ref >60)
Glucose, Bld: 110 mg/dL — ABNORMAL HIGH (ref 70–99)
Glucose, Bld: 110 mg/dL — ABNORMAL HIGH (ref 70–99)
Potassium: 3.6 mEq/L (ref 3.5–5.1)
Potassium: 3.8 mEq/L (ref 3.5–5.1)
Sodium: 138 mEq/L (ref 135–145)

## 2010-06-27 LAB — IRON AND TIBC: UIBC: 151 ug/dL

## 2010-06-27 LAB — RETICULOCYTES
RBC.: 3.19 MIL/uL — ABNORMAL LOW (ref 3.87–5.11)
Retic Count, Absolute: 102.1 10*3/uL (ref 19.0–186.0)
Retic Ct Pct: 3.2 % — ABNORMAL HIGH (ref 0.4–3.1)

## 2010-06-27 LAB — TSH: TSH: 1.839 u[IU]/mL (ref 0.350–4.500)

## 2010-06-27 LAB — CARDIAC PANEL(CRET KIN+CKTOT+MB+TROPI): Total CK: 12 U/L (ref 7–177)

## 2010-06-27 LAB — LIPID PANEL
HDL: 29 mg/dL — ABNORMAL LOW (ref >39)
Triglycerides: 90 mg/dL (ref <150)
VLDL: 18 mg/dL (ref 0–40)

## 2010-06-27 LAB — FERRITIN: Ferritin: 316 ng/mL — ABNORMAL HIGH (ref 10–291)

## 2010-06-27 LAB — TROPONIN I: Troponin I: <0.01 ng/mL (ref 0.00–0.06)

## 2010-06-27 LAB — FOLATE: Folate: 14.3 ng/mL

## 2010-06-27 LAB — MAGNESIUM: Magnesium: 1.9 mg/dL (ref 1.5–2.5)

## 2010-07-16 LAB — URINALYSIS, ROUTINE W REFLEX MICROSCOPIC
Bilirubin Urine: NEGATIVE
Glucose, UA: NEGATIVE mg/dL
Specific Gravity, Urine: 1.021 (ref 1.005–1.030)

## 2010-07-16 LAB — CBC
HCT: 32.8 % — ABNORMAL LOW (ref 36.0–46.0)
Hemoglobin: 11.6 g/dL — ABNORMAL LOW (ref 12.0–15.0)
MCV: 96.2 fL (ref 78.0–100.0)
RBC: 3.41 MIL/uL — ABNORMAL LOW (ref 3.87–5.11)
WBC: 6.7 10*3/uL (ref 4.0–10.5)

## 2010-07-16 LAB — DIFFERENTIAL
Eosinophils Absolute: 0.1 10*3/uL (ref 0.0–0.7)
Eosinophils Relative: 2 % (ref 0–5)
Lymphs Abs: 1.2 10*3/uL (ref 0.7–4.0)
Monocytes Absolute: 0.3 10*3/uL (ref 0.1–1.0)
Monocytes Relative: 5 % (ref 3–12)
Neutrophils Relative %: 76 % (ref 43–77)

## 2010-07-16 LAB — URINE MICROSCOPIC-ADD ON

## 2010-07-16 LAB — URINE CULTURE

## 2010-07-16 LAB — POCT I-STAT, CHEM 8
BUN: 22 mg/dL (ref 6–23)
Calcium, Ion: 1.13 mmol/L (ref 1.12–1.32)
Chloride: 106 mEq/L (ref 96–112)

## 2010-07-16 LAB — PROTIME-INR: INR: 2.57 — ABNORMAL HIGH (ref 0.00–1.49)

## 2010-08-25 NOTE — Assessment & Plan Note (Signed)
[facility]                          [facility]   MONZERRAT, WELLEN                     MRN:          [id number]  DATE:11/04/2008                            DOB:          [birth year]    PRIMARY CARDIOLOGIST:  Learta Codding, MD, [facility]   REASON FOR VISIT:  A 70-month followup.   Ms. Maiorana returns to our clinic, having lost approximately 70 pounds.  She has done this by cutting back on her calories, including eliminating  all soft drinks.   Clinically, she denies any interim development of exertional angina  pectoris.  She has mild, chronic exertional dyspnea.  She denies any  tachypalpitations.   Ms. Laningham has permanent atrial fibrillation and is on chronic Coumadin,  followed by Dr. Sherril Croon.   EKG today indicates atrial fibrillation at 67 bpm with nonspecific ST  abnormalities.   CURRENT MEDICATIONS:  1. Coumadin 5 mg, per Dr. Sherril Croon.  2. Lasix 40 mg daily.  3. Allopurinol 300 daily.  4. Meloxicam 7.5 b.i.d.  5. Potassium 10 daily.  6. Metoprolol 25 b.i.d.  7. Diltiazem ER 300 daily.  8. Diovan 160 daily.   PHYSICAL EXAMINATION:  VITAL SIGNS:  Blood pressure 110/70, pulse 67 and  irregular, weight 206 (down 67).  GENERAL:  A 77 year old female, obese, sitting upright, no distress.  HEENT:  Normocephalic and atraumatic.  NECK:  Palpable carotid pulses without bruits; unable to assess JVD,  secondary to neck girth.  LUNGS:  Clear to auscultation bilaterally.  HEART:  Irregularly regular.  No significant murmurs.  ABDOMEN:  Protuberant and nontender.  EXTREMITIES:  1-2+ bilateral lower extremity edema.  NEUROLOGIC:  No focal deficits.   IMPRESSION:  1. Permanent atrial fibrillation.      a.     Rate controlled.      b.     Chronic Coumadin, per Dr. Sherril Croon.  2. Chronic diastolic heart failure.      a.     Negative dobutamine stress echo in 1998.  3. COPD.      a.     History of tobacco.  4. Chronic renal  insufficiency.  5. Hypertension.  6. History of stroke.  7. Obesity.   PLAN:  1. Continue current medication regimen.  2. Schedule return clinic followup with myself and Dr. Andee Lineman in 6      months.  3. Per Dr. Andee Lineman, recommendation is to add trazodone 25 mg nightly      for insomnia, with instructions to increase this to 50 mg daily, if      needed.  The patient is to then follow up with Dr. Sherril Croon regarding      any further adjustments or recommendations.  4. Schedule return clinic followup with myself and Dr. Andee Lineman in 6      months.      Gene Serpe, PA-C  Electronically Signed      Learta Codding, MD,FACC  Electronically Signed   GS/MedQ  DD: 11/04/2008  DT: 11/05/2008  Job #: [id number]   cc:   Doreen Beam, MD

## 2010-08-25 NOTE — Assessment & Plan Note (Signed)
[facility]                          [facility]   Shelia Thompson, Shelia Thompson                     MRN:          [id number]  DATE:01/10/2008                            DOB:          14-Oct-1929    PRIMARY CARE PHYSICIAN:  Doreen Beam, MD   HISTORY OF PRESENT ILLNESS:  The patient is a 77 year old female with a  history of chronic dyspnea.  The patient previously had paroxysmal  atrial fibrillation.  She has now been diagnosed with permanent atrial  fibrillation.  She wore a CardioNet monitor last month and was  consistently in atrial fibrillation with quite rapid heart rates.  This  has been associated with increased shortness of breath on exertion.  She  denies, however, any chest pain.  She does appear limited in her  activities.  She denies any nausea or vomiting.   REVIEW OF SYSTEMS:  Remainder of review of systems is otherwise within  normal limits.   She does state that some of her medications make her dizzy in the  morning and then she takes meclizine.   MEDICATIONS:  1. Diltiazem CD 240 mg p.o. daily.  2. Benicar 20 mg p.o. daily.  3. Lasix 40 mg p.o. daily.  4. Coumadin 5 mg as directed.  5. Allopurinol.  6. __________ p.o. b.i.d.  7. Potassium.  8. Metoprolol 50 mg half a tablet p.o. b.i.d.   PHYSICAL EXAMINATION:  VITAL SIGNS:  Blood pressure is 123/66, heart  rate 80, saturation 98% on room air.  Weight is 173 pounds.  NECK:  Normal carotid upstroke.  No carotid bruits.  LUNGS:  Clear breath sounds bilaterally.  HEART:  Regular rate and rhythm.  Normal S1 and S2.  No murmurs or  gallops.  ABDOMEN:  Soft and nontender.  No rebound or guarding.  Good bowel  sounds.  EXTREMITIES:  No cyanosis, clubbing, or edema.   PROBLEM LIST:  1. Permanent atrial fibrillation.      a.     Coumadin therapy, followed by Dr. Sherril Croon.  2. Dyspnea, multifactorial.      a.     Chronic obstructive pulmonary disease by chest x-ray.      b.      Diastolic dysfunction.      c.     Right coronary artery disease.      d.     Atrial fibrillation with rapid heart rate.  3. History of cerebrovascular accident 10 years ago.  4. Chronic renal insufficiency with creatinine of 1.7.  5. Gout.  6. Hypertension.  7. Degenerative joint disease.  8. Lower extremity edema.   PLAN:  1. We will increase the patient's Lasix given her edema and shortness      of breath.  We will go up to 6 mg p.o. daily.  We will check a BMET      in 1 week, and if her creatinine remains stable at 1.7, then this      can be continued.  2. I did increase the patient's Cardizem CD to 300 mg p.o. daily for      better heart  rate control.  The patient can continue on Coumadin.  3. A Cardiolite stress study will be ordered to rule out ischemic      heart disease.     Learta Codding, MD,FACC  Electronically Signed    GED/MedQ  DD: 01/10/2008  DT: 01/11/2008  Job #: [id number]

## 2010-08-28 NOTE — Assessment & Plan Note (Signed)
[facility]                          Shelia Thompson   Shelia Thompson, Shelia Thompson                     MRN:          [id number]  DATE:07/13/2006                            DOB:          [birth year]    CARDIOLOGIST:  She is new to Dr. Andee Lineman.   PRIMARY CARE PHYSICIAN:  Dr. Sherril Croon.   HISTORY OF PRESENT ILLNESS:  Shelia Thompson is a 77 year old female patient  with a history of chronic dyspnea, who was seen in consultation by Dr.  Andee Lineman at [facility] on June 23, 2006.  She was admitted with  increasing shortness of breath and palpitations.  She was found to be in  atrial fibrillation.  She does have a history of paroxysmal atrial  fibrillation and apparently had a stroke some 10 years ago.  She ruled  out for myocardial infarction.  Echocardiogram revealed normal left  ventricular function.  By the time she was seen by Dr. Andee Lineman, she was  back in normal rhythm.  Dr. Andee Lineman recommended outpatient followup.  He  also recommended increasing her Cardizem to 240 mg daily.  Her followup  would consist of an outpatient adenosine Myoview as well as a CardioNet  event monitor.  She returns to the office today for followup.  Unfortunately, the event monitor and the Myoview have not been done yet.  She is doing well.  Denies any chest pain or current palpitations.  She  denies any significant dyspnea since discharge from the hospital.  She  is able to do normal activities of daily living without much limitation.  She denies orthopnea or paroxysmal nocturnal dyspnea.  She denies any  syncope.  She does Thompson some lower extremity edema.  This has been  fairly chronic.  Her right leg is worse than her left, and she notes  that this is a consequence of some back problems that she has had in the  past.  She is status post back surgery and is actually due for an MRI in  [place] some time next week on her back.   CURRENT MEDICATIONS:  1. Diltiazem CD 240  mg a day.  2. Benicar 20 mg daily.  3. Lasix 40 mg a day.  4. Coumadin as directed.  5. Allopurinol 300 mg daily.  6. Arthrotec 75 mg daily.  7. Potassium every other day.   ALLERGIES:  PREDNISONE and SULFA DRUGS.   PHYSICAL EXAM:  She is well-developed, well-nourished female in no acute  distress.  Blood pressure is 171/91, pulse 88.  Repeat blood pressure by me with a  large cuff was 132/84 on the right and 156/84 on the left.  Weight is  275.8 pounds.  HEENT:  Unremarkable.  NECK:  No JVD.  CARDIAC:  S1, S2.  Regular rate and rhythm.  LUNGS:  Clear to auscultation bilaterally.  No wheeze, rales, or  rhonchi.  ABDOMEN:  Soft and nontender.  Normoactive bowel sounds.  EXTREMITIES:  With 1 to 2+ edema bilaterally.  Calves are soft and  nontender.  SKIN:  Warm and dry.  NEUROLOGIC:  She is alert and oriented x3.  Cranial nerves 2-12 are  grossly intact.   IMPRESSION:  1. Paroxysmal atrial fibrillation.      a.     Coumadin therapy followed by Dr. Sherril Croon.      b.     Maintaining sinus rhythm.  2. Dyspnea - felt to be multifactorial.      a.     Chronic obstructive pulmonary disease by chest x-ray.  3. History of cerebrovascular accident secondary to atrial      fibrillation 10 years ago.  4. Chronic renal insufficiency with a creatinine of 1.7 during her      hospitalization.  5. Gout.  6. Hypertension.  7. Degenerative disk disease.   PLAN:  The patient presents to the office today for post hospitalization  followup.  She is doing well from a symptomatic standpoint.  Electrocardiogram today reveals that she is maintaining sinus rhythm  with a heart rate of 78.  She has nonspecific ST-T wave abnormality.  She has not yet taken her blood pressure medications today.  I think we  can hold off on making any further adjustments at this point in time.  I  will ask her to go ahead and take her medications when she gets home,  and return in the next 7-10 days after she has taken  her medications for  blood pressure checks, so that we can assure that she needs no further  adjustments.  She will continue follow up on her Coumadin with her  primary care physician.  She has also had some potassium replacement  added by her primary care physician.  She should followup with him for  that.  She will be set up for an adenosine Myoview to rule out ischemic  heart disease, as suggested by Dr. Andee Lineman when she was hospitalized.  She will also be set up for a CardioNet event monitor to assess for how  often she is going in and out of atrial fibrillation.  We will bring her  back in followup in about 4 to 6 weeks with Dr. Andee Lineman after the above  testing is completed.      Tereso Newcomer, PA-C  Electronically Signed      Jonelle Sidle, MD  Electronically Signed   SW/MedQ  DD: 07/13/2006  DT: 07/13/2006  Job #: [id number]   cc:   Doreen Beam

## 2010-08-28 NOTE — Discharge Summary (Signed)
. [facility]  Patient:    Shelia Thompson, Shelia Thompson                     MRN: [id number] Adm. Date:  [id number] Disc. Date: 05/23/00 Attending:  Herold Harms CC:         Doreen Beam, M.D. [place], [place]  Royal Piedra, M.D., [place], [place]   Discharge Summary  HISTORY OF PRESENT ILLNESS:  The patient is a 77 year old white female who suffers from an approximately seven-week history of severe right leg pain. She had failed medical management and was worked up with a lumbar MRI which demonstrated a herniated disk at L4-5 on the right.  She had intractable pain and was admitted for several days at [facility] where they tried an epidural steroid injection.  This, unfortunately, failed to improve her discomfort, and she was, therefore, transferred to me for further care and management at [facility].  For past medical history, past surgical history, medications prior to admission, drug allergies, family medical history, social history, admission physical examination, imaging studies, assessment plan, etc., please refer to the typed history and physical.  HOSPITAL COURSE:  I admitted the patient to [facility] on May 19, 2000, with the diagnosis of extraforaminal (far lateral) right L4-5 herniated nucleus pulposus, degenerative disk disease, lumbar radiculopathy, lumbago.  I discussed the various treatment options with the patient and her son and daughter-in-law, including surgery.  I described the procedure of a right L4-5 far lateral microdiskectomy as well as the risks.  The patient weighed the risks, benefits, and alternatives to surgery and decided to proceed with the operation.  I performed a right far lateral L4-5 microdiskectomy using microdissection without complications on May 19, 2000 (for full details of this operation, please refer to the typed operative note).  The patients postoperative course was essentially  unremarkable.  At surgery I did find a large herniated disk, and she did have significant improvement of her right leg pain after surgery.  She did continue to have some weakness in her right quadriceps and some difficulty with ambulation.  I, therefore, got a physical medicine and rehabilitation consult, and the patient was evaluated and thought to be appropriate for inpatient rehabilitation.  By May 23, 2000, the patient was afebrile, vital signs stable.  She was eating well.  Her wound was healing well, without signs of infection.  Her leg pain had improved significantly (although she was still having some dysesthesias, and I started Neurontin 300 mg b.i.d.).  At this point she was felt to be stable to transfer to rehabilitation and was transferred to rehabilitation on May 23, 2000.  FINAL DIAGNOSES:  1. Far lateral herniated nucleus pulposus L4-5.  2. Degenerative joint disease.  3. Lumbar radiculopathy.  4. Lumbago.  5. Atrial fibrillation.  6. Hypertension.  7. Sick sinus syndrome.  8. History of transient ischemic attack/cerebrovascular accident.  9. Dyspepsia. 10. Hematuria. 11. Squamous cell carcinoma of the face. 12. Eczema. 13. Incisional hernia. 14. Mitral regurgitation. 15. Remote history of cholecystitis, etc.  PROCEDURES:  Right L4-5 far lateral microdiskectomy using microdissection. DD:  05/24/99 TD:  05/24/00 Job: [id number]

## 2010-08-28 NOTE — Op Note (Signed)
[facility]  Patient:    Shelia Thompson, Shelia Thompson                     MRN: [id number] Proc. Date: 05/19/00 Adm. Date:  05/19/00 Attending:  Tressie Stalker D                           Operative Report  PREOPERATIVE DIAGNOSES:  Extraforaminal (far lateral) right L4-5 herniated nucleus pulposus, degenerative disk disease, lumbar radiculopathy, lumbago.  POSTOPERATIVE DIAGNOSES:  Extraforaminal (far lateral) right L4-5 herniated nucleus pulposus, degenerative disk disease, lumbar radiculopathy, lumbago.  PROCEDURE:  Right far lateral L4-5 mirodiskectomy using microdissection.  SURGEON:  Cristi Loron, M.D.  ANESTHESIA:  General endotracheal.  ESTIMATED BLOOD LOSS:  100 cc.  SPECIMENS:  None.  DRAINS:  None.  COMPLICATIONS:  None.  BRIEF HISTORY:  The patient is a 77 year old white female who suffers from an approximately six-week history of severe right leg pain.  She failed medical management and was worked up with a lumbar MRI, which demonstrated a herniated disk at L4-5 on the right.  She had intractable pain and was admitted for several days at another hospital and treated with epidural steroid injection that failed to improve her discomfort, and she was therefore transferred for my further evaluation and management.  The patient weighed the risks, benefits, and alternatives of surgery and decided to proceed with a right L4-5 microdiskectomy.  Signs and symptoms were consistent with a right L4 radiculopathy.  DESCRIPTION OF PROCEDURE:  The patient was brought to the operating room by the anesthesia team.  General endotracheal anesthesia was induced.  The patient was then turned to the prone position on the Wilson frame.  Her lumbosacral region was then prepared with Betadine scrub and Betadine solution, and sterile drapes were applied, and I injected the area to be incised with a Marcaine with epinephrine solution.  I used a scalpel to  make a vertical midline incision over the L4-5 interspace.  I used electrocautery to dissect down to the thoracolumbar fascia, divided the fascia just to the right of midline, and stripping the paraspinous musculature via a subperiosteal dissection from the right spinous process of the laminae of L3, L4, and L5.  I inserted the McCullough retractor for exposure and then obtained an intraoperative radiograph.  I then brought the operating microscope into the field and under its magnification and illumination, I used electrocautery to detach the fascia from the lateral aspect of the right L4-5 facet.  I then used a high-speed drill to drill off the lateral aspect of the facet as well as the lateral aspect of the L4 pars region.  This exposed the intertransverse ligament.  I incised the ligament and removed it with a Kerrison punch.  I then dissected through the intertransverse muscle and identified the right L4 nerve root as it exited the neural foramen.  I used a Kerrison punch to remove some more bone medially to get a good look at the nerve as it exited around the right L4 pedicle.  The nerve was displaced posteriorly and somewhat swollen and erythematous.  I used microdissection to free up the nerve root and then carefully performed a foraminotomy with the Kerrison punch.  I then mobilized the nerve root and gently retracted it in a cephalad direction with the DErrico retractor.  This exposed a large free-fragment disk herniation, which I removed in several fragments using the pituitary forceps.  I felt beneath the nerve root with the coronary dilator and milked some more fragments.  There was a fairly big hole in the annulus fibrosus in the neural foramen.  I widened the hole with the 15 blade scalpel and performed a partial diskectomy in the intervertebral disk space.  I then palpated about the nerve root, and it was well-decompressed all the way from within the spinal canal all the way  down to the soft tissues as the nerve turned ventrally.  I then achieved stringent hemostasis using bipolar electrocautery.  I copiously irrigated the wound with bacitracin solution and removed the solution.  I then deposited Depo-Medrol and fentanyl solution about the right L4 nerve root.  I then removed the McCullough retractor and reapproximated the patients thoracolumbar fascia with interrupted #1 Vicryl, the subcutaneous tissue with interrupted 2-0 Vicryl, the skin with Steri-Strips and benzoin.  The wound was then coated with bacitracin ointment and a sterile dressing was applied, the drapes were removed, and the patient was subsequently returned to the supine position, where she was extubated by the anesthesia team and transported to the postanesthesia care unit in stable condition.  All sponge, instrument, and needle counts were correct at the end of this case. DD:  05/19/00 TD:  05/21/00 Job: [id number]

## 2010-08-28 NOTE — H&P (Signed)
[place]. [facility]  Patient:    CODA, FILLER                     MRN: [id number] Adm. Date:  [id number] Attending:  Sandi Raveling CC:         Ignatius Specking, M.D. - [place], [place]  Royal Piedra, M.D. - [place], [place]   History and Physical  CHIEF COMPLAINT:  Right leg pain.  HISTORY OF PRESENT ILLNESS:   The patient is a 77 year old white female who has had severe right leg pain for approximately six weeks.  She does not recall any specific precipitating event except that she rolled over in bed and began having pain.  She saw her primary doctor, Dr. Ignatius Specking, up in [place], who treated her with medications.  She failed to improve and was referred to Dr. Trudee Grip, an orthopedic surgeon in [place], and was worked up with a hip MRI and a lumbar MRI.  Apparently a "bulging disk" was diagnosed, and there was some talk about doing an injection.  In any event, she has continued to have the discomfort despite medical management, and was admitted to [facility] on May 15, 2000, by Dr. Sherril Croon.  During this hospitalization Dr. Royal Piedra in [place] was consulted, and subsequently placed a lumbar epidural steroid injection after the patients PT and PTT had been normalized.  (She is on chronic Coumadin, secondary to a cerebrovascular accident.)  The patient unfortunately got absolutely no relief from this injection.  She "couldnt tell the difference," and continued to have severe right leg pain.  Dr. Sherril Croon called and requested a transfer for further evaluation and management by me.  We transferred her down to [facility] today.  The patient presently complains of severe pain radiating down her right leg. It is in her right leg anterolaterally.  It radiates medially around her knee, associated with numbness, tingling, and paresthesias.  She says she has had weakness in her leg.  Her leg gives out on her.  She cannot bear weight.   She has no trouble on her right.  She does have some back pain, but her leg is bothering her much more than her back.  She has not improved, despite time and medical management, including muscle relaxants, steroid, pills, and shots, etc.  She has been managed with high-dose morphine at [facility], without relief.  PAST MEDICAL HISTORY:  1. Positive for atrial fibrillation with chronic Coumadin therapy.  2. Hypertension.  3. Sick sinus syndrome.  4. History of transient ischemic attack/light stroke.  5. History of dyspepsia.  6. History of hematuria.  7. Squamous cell carcinoma of the face.  8. Eczema.  9. Incisional hernia. 10. Mitral regurgitation. 11. Remote history of cholecystitis.  PAST SURGICAL HISTORY: 1. Cholecystectomy about 10 years ago. 2. Vaginal hysterectomy in the remote past.  CURRENT MEDICATIONS PRIOR TO ADMISSION: 1. Norvasc 5 mg p.o. q.d. 2. Cardizem, unknown dose p.o. q.d. 3. Coumadin 7.5 mg q.d. with 10 mg q. Monday.  She stopped taking this    upon admission to [facility], and her PT is near normal.  ALLERGIES:  SULFA causes a rash.  FAMILY HISTORY:  The patients mother died in her 81s of a myocardial infarction.  The patients father died in his 25s secondary to a myocardial infarction.  SOCIAL HISTORY:  The patient lives in [place].  She is a widow.  She has two children.  She denies tobacco, ethanol, or drug use.  She is retired.  REVIEW OF SYSTEMS:  Negative except as above.  She says she feels fine except her leg hurts.  PHYSICAL EXAMINATION:  GENERAL:  Obese, pleasant 77 year old female, in obvious distress, complaining of right leg pain.  HEENT:  Normocephalic, atraumatic.  Pupils equal, round, reactive to light. Extraocular muscles intact.  Oropharynx benign.  Vision is grossly normal bilaterally with corrective lenses.  Tympanic membranes grossly bilaterally.  NECK:  Supple, no masses or deformities,  tracheal deviation, or jugular venous distention.  HEART:  Irregularly irregular.  ABDOMEN:  Soft, nontender, obese.  LUNGS:  Clear to auscultation.  BACK:  No point tenderness or deformity.  Straight leg raising testing and Fabere testing are negative bilaterally.  EXTREMITIES:  Obese, no obvious deformities.  NEUROLOGIC:  The patient is alert and oriented x 3.  Cranial nerves II-XII are grossly intact bilaterally.  Motor strength 5/5 in the bilateral deltoid, biceps, triceps, hand grip, wrist extensor, psoas, extensor hallucis longus, gastrocnemius, and left quadriceps.  The patients right quadriceps strength is diminished at 4/5.  Cerebellar examination is intact to rapid alternating movements throughout the upper extremities bilaterally.  Sensory examination demonstrates decreased light touch sensation in the patients right anterolateral thigh, consistent with the L4 distribution.  Deep tendon reflexes are somewhat difficult to assess, as the patients extremities are quite obese, but certainly failed to have a right quadriceps reflex. Otherwise has trace reflexes.  IMAGING STUDIES:  I have reviewed the patients lumbar MRI performed at [facility] on May 07, 2000.  The sagittal images demonstrate a normal lumbar lordosis.  She has multilevel degenerative disk disease on the axial image.  The L1-2, L2-3 are normal.  L3-4 has some mild extraforaminal bulging on the right.  Does not appear to have any significant compression of the right L3 nerve root.  L4-5 has what appears to be a large extraforaminal disk compressing the right L4 nerve root.  L5-S1 has a small central bulging disk.  No significant neural compression.  ASSESSMENT/PLAN: 1. Right L4-5 extraforaminal herniated nucleus pulposus, degenerative    disk disease, spinal stenosis, lumbar radiculopathy.  PLAN:  I have discussed the situation with the patient and with her son and  daughter-in-law.  I  reviewed the MR scan with them and pointed out the abnormalities.  Her signs and symptoms and physical examination seem consistent with a right L4 radiculopathy caused by a herniated disk at L4-5. She has thus far failed extensive medical management, including medications, time, steroid injections, etc.  She still has severe right leg pain.  I have discussed the various treatment options, including doing nothing, continuing medical management, and more steroid shots, which I do not think will help, (as he first one did not help a bit); and surgery.  I have described the procedure as a right L4-5 extraforaminal microdiskectomy.  I discussed the risks of surgery, including the risks of anesthesia, hemorrhage, infection, dural tear, injury to the lumbar nerve root causing temporary or permanent leg pain, numbness, or weakness, recurrent disk herniation, medical complications such as pneumonia, heart attack, stroke, etc., failure to relieve the pain, worsening of the pain.  The patient has weighed the risks, benefits, and alternatives to surgery, and wants to proceed with the operation as soon as possible.  I have answered all of her questions.   2. History of multiple medical problems, including atrial fibrillation.  3. Hypertension.  4. Sick sinus syndrome.  5. Transient  ischemic.  6. Squamous cell carcinoma.  7. Eczema.  8. Glucose intolerance.  9. Incisional hernia. 10. Rheumatoid arthritis. 11. Cholecystitis, etc. DD:  05/19/00 TD:  05/19/00 Job: [id number]

## 2010-08-28 NOTE — Discharge Summary (Signed)
[place]. [facility]  Patient:    Shelia Thompson, Shelia Thompson                     MRN: [id number] Adm. Date:  [id number] Disc. Date: [id number] Attending:  Herold Harms Dictator:   Junie Bame, P.A. CC:         Cristi Loron, M.D.  Dr. Sherril Croon, [place], [place]   Discharge Summary  HISTORY OF PRESENT ILLNESS:  Ms. Shelia Thompson is a 77 year old white female with a Past Medical History of atrial fibrillation and hypertension who was admitted on May 19, 2000, for lower back pain which had been progressing.  Prior to admission Ms. Shelia Thompson was at [facility] for a lumbar epidural steroid injection.  The first one was given on May 15, 2000, and second on May 17, 2000, with no improvement.  MRI was performed at [facility].  The MRI performed at [facility] showed a lumbosacral DJD with bulging disk on the right side and a lateral disk protrusion touching right L4 nerve and midcentral disk protrusion of L5-S1 level.  The patient had a lumbar laminectomy, diskectomy L4-L5 due to lateral disk L4-L5 performed by Dr. Lovell Sheehan at [facility] on May 19, 2000.  The patient presently is not taking any anticoagulants.  PT report states the patient is ambulating approximately 40 feet with rolling walker with close supervision.  The patient had no complications postoperatively.  PAST MEDICAL HISTORY:  Significant for atrial fibrillation, hypertension, TIA rheumatoid arthritis, cholelithiasis, sick sinus syndrome, dyspnea, and mitral regurgitation.  PAST SURGICAL HISTORY:  Cholecystectomy two years ago, vaginal hysterectomy.  ALLERGIES:  SULFA causes rash.  MEDICATIONS PRIOR TO ADMISSION: 1. Norvasc 5 mg. 2. Cardizem. 3. Vitamin K. 4. Celebrex 200 mg. 5. Coumadin 7.5 mg q.d. and 10 mg q.d. on Mondays.  SOCIAL HISTORY:  The [place] two-story home, has four steps to enter home, and she can sleep on the  first floor.  The patient was totally independent prior to admission.  She is retired and has two adult children.  She denies any smoking or alcohol usage.  PRIMARY CARE Roy Tokarz:  Dr. Sherril Croon.  LABORATORY DATA:  There were no new labs.  HOSPITAL COURSE:  Ms. Shelia Thompson was admitted to the rehab center on May 23, 2000, for comprehensive inpatient rehabilitation and received more than three hours of PT and OT twice daily.  Ms. Shelia Thompson did incredibly well during her four-day stay.  Her blood pressure remained controlled with Norvasc, and her labs were stable.  Her latest hemoglobin showed 11.6, and a white blood cell count was 8.5.  Ms. Shelia Thompson was placed back on scheduled dose Coumadin on May 26, 2000, for atrial fibrillation.  The patients pain remained controlled with Neurontin and oxycodone.  No major health issues occurred.  DISCHARGE LABORATORY DATA:  INR was 1.2.  PHYSICAL EXAMINATION AT DISCHARGE:  VITAL SIGNS:  At time of discharge, her blood pressure was 140/80, temperature was 97.7, and pulse 80.  CHEST:  Lungs were clear to auscultation bilaterally.  ABDOMEN:  Abdomen was soft, nondistended, nontender with positive bowel sounds.  EXTREMITIES:  There was no clubbing, cyanosis, or edema.  DISPOSITION:  The patient was discharged to home.  DISCHARGE MEDICATIONS: 1. Norvasc 5 mg one tablet q.d. 2. Celebrex 200 mg one tablet b.i.d. 3. Cardizem 100 mg one tablet q.d. 4. Coumadin 7.5 mg one tablet q.d. except Mondays and Coumadin 10 mg one  tablet on Mondays. 5. Darvocet which she had already at home one tablet q.4-6h. p.r.n. pain.  DISCHARGE INSTRUCTIONS:  Activity:  The patient was to use walker and observe back precautions with no heavy lifting or excess bending.  Diet:  No restrictions.  Wound care:  Keep area clean and dry.  SPECIAL DISCHARGE INSTRUCTIONS:  Her INR will be followed by Dr. Sherril Croon and home health nurses to draw her blood and perform PT and OT with  [facility] nurse at [phone].  DISCHARGE FOLLOWUP:  She is to follow up with Dr. Thomasena Edis as needed, follow up with Dr. Lovell Sheehan within one week, and follow up with Dr. Sherril Croon as needed within a month. DD:  05/31/00 TD:  06/01/00 Job: [id number]

## 2010-10-16 ENCOUNTER — Encounter: Payer: Self-pay | Admitting: Cardiology

## 2011-11-28 DIAGNOSIS — R109 Unspecified abdominal pain: Secondary | ICD-10-CM

## 2012-07-30 DIAGNOSIS — R112 Nausea with vomiting, unspecified: Secondary | ICD-10-CM

## 2013-04-26 ENCOUNTER — Emergency Department (HOSPITAL_COMMUNITY): Payer: Medicare Other

## 2013-04-26 ENCOUNTER — Encounter (HOSPITAL_COMMUNITY): Payer: Self-pay | Admitting: Emergency Medicine

## 2013-04-26 ENCOUNTER — Inpatient Hospital Stay (HOSPITAL_COMMUNITY)
Admission: EM | Admit: 2013-04-26 | Discharge: 2013-05-05 | DRG: 191 | Disposition: A | Discharge status: Skilled Nursing Facility | Payer: Medicare Other | Attending: Internal Medicine | Admitting: Internal Medicine

## 2013-04-26 DIAGNOSIS — I129 Hypertensive chronic kidney disease with stage 1 through stage 4 chronic kidney disease, or unspecified chronic kidney disease: Secondary | ICD-10-CM | POA: Diagnosis present

## 2013-04-26 DIAGNOSIS — F0390 Unspecified dementia without behavioral disturbance: Secondary | ICD-10-CM | POA: Diagnosis present

## 2013-04-26 DIAGNOSIS — J440 Chronic obstructive pulmonary disease with acute lower respiratory infection: Secondary | ICD-10-CM | POA: Diagnosis present

## 2013-04-26 DIAGNOSIS — I4891 Unspecified atrial fibrillation: Secondary | ICD-10-CM | POA: Diagnosis present

## 2013-04-26 DIAGNOSIS — I5032 Chronic diastolic (congestive) heart failure: Secondary | ICD-10-CM | POA: Diagnosis present

## 2013-04-26 DIAGNOSIS — I509 Heart failure, unspecified: Secondary | ICD-10-CM | POA: Diagnosis present

## 2013-04-26 DIAGNOSIS — F411 Generalized anxiety disorder: Secondary | ICD-10-CM | POA: Diagnosis present

## 2013-04-26 DIAGNOSIS — J209 Acute bronchitis, unspecified: Principal | ICD-10-CM | POA: Diagnosis present

## 2013-04-26 DIAGNOSIS — E876 Hypokalemia: Secondary | ICD-10-CM | POA: Diagnosis present

## 2013-04-26 DIAGNOSIS — Z7901 Long term (current) use of anticoagulants: Secondary | ICD-10-CM | POA: Diagnosis not present

## 2013-04-26 DIAGNOSIS — Z8673 Personal history of transient ischemic attack (TIA), and cerebral infarction without residual deficits: Secondary | ICD-10-CM

## 2013-04-26 DIAGNOSIS — Z87891 Personal history of nicotine dependence: Secondary | ICD-10-CM | POA: Diagnosis not present

## 2013-04-26 DIAGNOSIS — Z888 Allergy status to other drugs, medicaments and biological substances status: Secondary | ICD-10-CM

## 2013-04-26 DIAGNOSIS — J111 Influenza due to unidentified influenza virus with other respiratory manifestations: Secondary | ICD-10-CM | POA: Diagnosis present

## 2013-04-26 DIAGNOSIS — Z823 Family history of stroke: Secondary | ICD-10-CM

## 2013-04-26 DIAGNOSIS — K589 Irritable bowel syndrome without diarrhea: Secondary | ICD-10-CM | POA: Diagnosis present

## 2013-04-26 DIAGNOSIS — D709 Neutropenia, unspecified: Secondary | ICD-10-CM | POA: Diagnosis present

## 2013-04-26 DIAGNOSIS — N189 Chronic kidney disease, unspecified: Secondary | ICD-10-CM | POA: Diagnosis present

## 2013-04-26 DIAGNOSIS — R0602 Shortness of breath: Secondary | ICD-10-CM

## 2013-04-26 DIAGNOSIS — N390 Urinary tract infection, site not specified: Secondary | ICD-10-CM | POA: Diagnosis present

## 2013-04-26 DIAGNOSIS — Z8249 Family history of ischemic heart disease and other diseases of the circulatory system: Secondary | ICD-10-CM | POA: Diagnosis not present

## 2013-04-26 DIAGNOSIS — J449 Chronic obstructive pulmonary disease, unspecified: Secondary | ICD-10-CM

## 2013-04-26 DIAGNOSIS — J069 Acute upper respiratory infection, unspecified: Secondary | ICD-10-CM

## 2013-04-26 LAB — CBC WITH DIFFERENTIAL/PLATELET
BASOS ABS: 0 10*3/uL (ref 0.0–0.1)
Basophils Relative: 0 % (ref 0–1)
EOS PCT: 0 % (ref 0–5)
Eosinophils Absolute: 0 10*3/uL (ref 0.0–0.7)
HCT: 32.7 % — ABNORMAL LOW (ref 36.0–46.0)
Hemoglobin: 11.5 g/dL — ABNORMAL LOW (ref 12.0–15.0)
LYMPHS ABS: 0.6 10*3/uL — AB (ref 0.7–4.0)
Lymphocytes Relative: 24 % (ref 12–46)
MCH: 30.3 pg (ref 26.0–34.0)
MCHC: 35.2 g/dL (ref 30.0–36.0)
MCV: 86.3 fL (ref 78.0–100.0)
Monocytes Absolute: 0.3 10*3/uL (ref 0.1–1.0)
Monocytes Relative: 12 % (ref 3–12)
NEUTROS ABS: 1.7 10*3/uL (ref 1.7–7.7)
NEUTROS PCT: 63 % (ref 43–77)
PLATELETS: 125 10*3/uL — AB (ref 150–400)
RBC: 3.79 MIL/uL — AB (ref 3.87–5.11)
RDW: 19.2 % — AB (ref 11.5–15.5)
WBC: 2.6 10*3/uL — AB (ref 4.0–10.5)

## 2013-04-26 LAB — URINALYSIS, ROUTINE W REFLEX MICROSCOPIC
Glucose, UA: NEGATIVE mg/dL
Hgb urine dipstick: NEGATIVE
Ketones, ur: NEGATIVE mg/dL
LEUKOCYTES UA: NEGATIVE
Nitrite: POSITIVE — AB
Protein, ur: 100 mg/dL — AB
Specific Gravity, Urine: >1.03 — ABNORMAL HIGH (ref 1.005–1.030)
UROBILINOGEN UA: 0.2 mg/dL (ref 0.0–1.0)
pH: 6 (ref 5.0–8.0)

## 2013-04-26 LAB — BASIC METABOLIC PANEL
BUN: 12 mg/dL (ref 6–23)
CHLORIDE: 99 meq/L (ref 96–112)
CO2: 30 meq/L (ref 19–32)
Calcium: 8.8 mg/dL (ref 8.4–10.5)
Creatinine, Ser: 0.9 mg/dL (ref 0.50–1.10)
GFR calc Af Amer: 68 mL/min — ABNORMAL LOW (ref >90)
GFR calc non Af Amer: 58 mL/min — ABNORMAL LOW (ref >90)
GLUCOSE: 173 mg/dL — AB (ref 70–99)
POTASSIUM: 3 meq/L — AB (ref 3.7–5.3)
SODIUM: 143 meq/L (ref 137–147)

## 2013-04-26 LAB — LACTIC ACID, PLASMA: LACTIC ACID, VENOUS: 1.8 mmol/L (ref 0.5–2.2)

## 2013-04-26 LAB — PROTIME-INR
INR: 3.07 — AB (ref 0.00–1.49)
PROTHROMBIN TIME: 30.6 s — AB (ref 11.6–15.2)

## 2013-04-26 LAB — URINE MICROSCOPIC-ADD ON

## 2013-04-26 LAB — CLOSTRIDIUM DIFFICILE BY PCR: CDIFFPCR: NEGATIVE

## 2013-04-26 MED ORDER — GABAPENTIN 100 MG PO CAPS
100.0000 mg | ORAL_CAPSULE | Freq: Every day | ORAL | Status: DC
  Administered 2013-04-26 – 2013-05-04 (×9): 100 mg via ORAL
  Filled 2013-04-26 (×9): qty 1

## 2013-04-26 MED ORDER — LEVOFLOXACIN IN D5W 500 MG/100ML IV SOLN
500.0000 mg | INTRAVENOUS | Status: DC
  Administered 2013-04-26 – 2013-04-27 (×2): 500 mg via INTRAVENOUS
  Filled 2013-04-26 (×4): qty 100

## 2013-04-26 MED ORDER — ONDANSETRON HCL 4 MG PO TABS
4.0000 mg | ORAL_TABLET | Freq: Four times a day (QID) | ORAL | Status: DC | PRN
  Administered 2013-04-30 – 2013-05-01 (×2): 4 mg via ORAL
  Filled 2013-04-26 (×2): qty 1

## 2013-04-26 MED ORDER — ALBUTEROL SULFATE (2.5 MG/3ML) 0.083% IN NEBU
2.5000 mg | INHALATION_SOLUTION | RESPIRATORY_TRACT | Status: DC
  Administered 2013-04-27 – 2013-04-29 (×11): 2.5 mg via RESPIRATORY_TRACT
  Filled 2013-04-26 (×12): qty 3

## 2013-04-26 MED ORDER — ONDANSETRON HCL 4 MG/2ML IJ SOLN
4.0000 mg | Freq: Four times a day (QID) | INTRAMUSCULAR | Status: DC | PRN
  Administered 2013-04-28: 4 mg via INTRAVENOUS
  Filled 2013-04-26: qty 2

## 2013-04-26 MED ORDER — DONEPEZIL HCL 5 MG PO TABS
10.0000 mg | ORAL_TABLET | Freq: Every day | ORAL | Status: DC
  Administered 2013-04-26 – 2013-05-04 (×9): 10 mg via ORAL
  Filled 2013-04-26 (×10): qty 2

## 2013-04-26 MED ORDER — ALBUTEROL SULFATE (2.5 MG/3ML) 0.083% IN NEBU
2.5000 mg | INHALATION_SOLUTION | RESPIRATORY_TRACT | Status: DC | PRN
  Administered 2013-04-26: 2.5 mg via RESPIRATORY_TRACT
  Filled 2013-04-26: qty 3

## 2013-04-26 MED ORDER — OSELTAMIVIR PHOSPHATE 75 MG PO CAPS
75.0000 mg | ORAL_CAPSULE | Freq: Two times a day (BID) | ORAL | Status: AC
  Administered 2013-04-26 – 2013-04-30 (×8): 75 mg via ORAL
  Filled 2013-04-26 (×8): qty 1

## 2013-04-26 MED ORDER — WARFARIN - PHARMACIST DOSING INPATIENT
Freq: Every day | Status: DC
  Administered 2013-05-01 – 2013-05-04 (×4)

## 2013-04-26 MED ORDER — SODIUM CHLORIDE 0.9 % IJ SOLN
3.0000 mL | Freq: Two times a day (BID) | INTRAMUSCULAR | Status: DC
  Administered 2013-04-30 – 2013-05-05 (×10): 3 mL via INTRAVENOUS

## 2013-04-26 MED ORDER — LEVOFLOXACIN IN D5W 500 MG/100ML IV SOLN
INTRAVENOUS | Status: AC
  Filled 2013-04-26: qty 100

## 2013-04-26 MED ORDER — ALPRAZOLAM 0.25 MG PO TABS
0.2500 mg | ORAL_TABLET | Freq: Every day | ORAL | Status: DC
  Administered 2013-04-26 – 2013-04-30 (×5): 0.25 mg via ORAL
  Filled 2013-04-26 (×5): qty 1

## 2013-04-26 MED ORDER — DILTIAZEM HCL ER COATED BEADS 180 MG PO CP24
300.0000 mg | ORAL_CAPSULE | Freq: Every day | ORAL | Status: DC
  Administered 2013-04-27 – 2013-05-05 (×9): 300 mg via ORAL
  Filled 2013-04-26 (×18): qty 1

## 2013-04-26 MED ORDER — ALBUTEROL SULFATE (2.5 MG/3ML) 0.083% IN NEBU
5.0000 mg | INHALATION_SOLUTION | Freq: Once | RESPIRATORY_TRACT | Status: AC
  Administered 2013-04-26: 5 mg via RESPIRATORY_TRACT
  Filled 2013-04-26: qty 6

## 2013-04-26 MED ORDER — DEXTROSE 5 % IV SOLN
1.0000 g | Freq: Once | INTRAVENOUS | Status: AC
  Administered 2013-04-26: 1 g via INTRAVENOUS
  Filled 2013-04-26: qty 10

## 2013-04-26 MED ORDER — ACETAMINOPHEN 650 MG RE SUPP: 650.0000 mg | Freq: Four times a day (QID) | RECTAL | Status: DC | PRN

## 2013-04-26 MED ORDER — POTASSIUM CHLORIDE IN NACL 20-0.9 MEQ/L-% IV SOLN
INTRAVENOUS | Status: DC
  Administered 2013-04-26 – 2013-04-29 (×5): via INTRAVENOUS

## 2013-04-26 MED ORDER — ALLOPURINOL 300 MG PO TABS
150.0000 mg | ORAL_TABLET | Freq: Every day | ORAL | Status: DC
  Administered 2013-04-27 – 2013-05-05 (×9): 150 mg via ORAL
  Filled 2013-04-26 (×9): qty 1

## 2013-04-26 MED ORDER — SERTRALINE HCL 50 MG PO TABS
100.0000 mg | ORAL_TABLET | Freq: Every day | ORAL | Status: DC
  Administered 2013-04-27 – 2013-05-05 (×9): 100 mg via ORAL
  Filled 2013-04-26 (×9): qty 2

## 2013-04-26 MED ORDER — METOPROLOL SUCCINATE ER 50 MG PO TB24
50.0000 mg | ORAL_TABLET | Freq: Two times a day (BID) | ORAL | Status: DC
  Administered 2013-04-26 – 2013-05-05 (×18): 50 mg via ORAL
  Filled 2013-04-26 (×18): qty 1

## 2013-04-26 MED ORDER — PANTOPRAZOLE SODIUM 40 MG PO TBEC
40.0000 mg | DELAYED_RELEASE_TABLET | Freq: Two times a day (BID) | ORAL | Status: DC
  Administered 2013-04-26 – 2013-05-05 (×18): 40 mg via ORAL
  Filled 2013-04-26 (×18): qty 1

## 2013-04-26 MED ORDER — ACETAMINOPHEN 325 MG PO TABS
650.0000 mg | ORAL_TABLET | Freq: Four times a day (QID) | ORAL | Status: DC | PRN
  Administered 2013-04-26 – 2013-05-04 (×6): 650 mg via ORAL
  Filled 2013-04-26 (×8): qty 2

## 2013-04-26 MED ORDER — LORATADINE 10 MG PO TABS
10.0000 mg | ORAL_TABLET | Freq: Every day | ORAL | Status: DC
  Administered 2013-04-27 – 2013-05-05 (×9): 10 mg via ORAL
  Filled 2013-04-26 (×9): qty 1

## 2013-04-26 NOTE — Progress Notes (Signed)
ANTICOAGULATION CONSULT NOTE - Initial Consult  Pharmacy Consult for Warfarin Indication: atrial fibrillation  Allergies  Allergen Reactions  . Prednisone     REACTION: unspecified  . Sulfamethoxazole     REACTION: unspecified    Patient Measurements:   Heparin Dosing Weight:   Vital Signs: Temp: 97.6 F (36.4 C) (01/15 1853) Temp src: Oral (01/15 1853) BP: 128/64 mmHg (01/15 1853) Pulse Rate: 97 (01/15 1853)  Labs:  Recent Labs  04/26/13 1523  HGB 11.5*  HCT 32.7*  PLT 125*  LABPROT 30.6*  INR 3.07*  CREATININE 0.90    The CrCl is unknown because both a height and weight (above a minimum accepted value) are required for this calculation.   Medical History: Past Medical History  Diagnosis Date  . Unspecified essential hypertension   . Overweight   . Heart failure, diastolic, chronic   . Atrial fibrillation   . COPD (chronic obstructive pulmonary disease)   . Chronic renal insufficiency   . Stroke     hx    Medications:  Prescriptions prior to admission  Medication Sig Dispense Refill  . allopurinol (ZYLOPRIM) 300 MG tablet Take 150 mg by mouth daily.      Marland Kitchen. ALPRAZolam (XANAX) 0.25 MG tablet Take 0.25 mg by mouth at bedtime.       Marland Kitchen. diltiazem (CARDIZEM CD) 300 MG 24 hr capsule Take 300 mg by mouth daily.      Marland Kitchen. donepezil (ARICEPT) 10 MG tablet Take 10 mg by mouth at bedtime.      . gabapentin (NEURONTIN) 100 MG capsule Take 100 mg by mouth at bedtime.      Marland Kitchen. loratadine (CLARITIN) 10 MG tablet Take 10 mg by mouth daily.      . metoprolol succinate (TOPROL-XL) 50 MG 24 hr tablet Take 50 mg by mouth 2 (two) times daily. Take with or immediately following a meal.      . oseltamivir (TAMIFLU) 75 MG capsule Take 75 mg by mouth 2 (two) times daily. For 5 days(started on 04/25/13)      . pantoprazole (PROTONIX) 40 MG tablet Take 40 mg by mouth 2 (two) times daily.      . predniSONE (DELTASONE) 5 MG tablet Take 5 mg by mouth daily.      . sertraline (ZOLOFT) 100  MG tablet Take 100 mg by mouth daily.      Marland Kitchen. warfarin (COUMADIN) 5 MG tablet Take 5 mg by mouth daily.          Assessment: Warfarin for AFIB, PTA Warfarin 5 mg daily INR > 3 on admission  Goal of Therapy:  INR 2-3 Monitor platelets by anticoagulation protocol: Yes   Plan:  No Warfarin tonight, INR > 3 INR/PT daily Labs per protocol  Raquel JamesPittman, Oliviana Mcgahee Bennett 04/26/2013,7:54 PM

## 2013-04-26 NOTE — H&P (Signed)
[facility] History and Physical  Shelia PhiKatherine Ramirez NWG:[id number]:[id number] DOB: 1929/08/08 DOA: 04/26/2013  Referring physician:  PCP: Donzetta SprungANIEL, TERRY, MD  Specialists:   Chief Complaint: SOB, cough  HPI: Shelia Thompson is a 80 y.o. female with PMH of HTN, HPL, COPD, a fib on coumadin,  spinal DJD, h/o CVA, leg weakness on wheelchair presented with progressively worsening SOB, productive cough, fever, chills, post recent Dx influenza; she was started on tamiflu btu symptoms got worse with nausea, diarrhea, fever, cough;   Review of Systems: The patient denies anorexia, weight loss,, vision loss, decreased hearing, hoarseness, chest pain, syncope, dyspnea on exertion, peripheral edema, balance deficits, hemoptysis, abdominal pain, melena, hematochezia, severe indigestion/heartburn, hematuria, incontinence, genital sores, muscle weakness, suspicious skin lesions, transient blindness, difficulty walking, depression, unusual weight change, abnormal bleeding, enlarged lymph nodes, angioedema, and breast masses.    Past Medical History  Diagnosis Date  . Unspecified essential hypertension   . Overweight   . Heart failure, diastolic, chronic   . Atrial fibrillation   . COPD (chronic obstructive pulmonary disease)   . Chronic renal insufficiency   . Stroke     hx   Past Surgical History  Procedure Laterality Date  . Back surgery    . Cholecystectomy    . Total abdominal hysterectomy w/ bilateral salpingoophorectomy     Social History:  reports that she has quit smoking. She does not have any smokeless tobacco history on file. She reports that she does not drink alcohol. Her drug history is not on file. NH;  where does patient live--home, ALF, SNF? and with whom if at home? No;  Can patient participate in ADLs?  Allergies  Allergen Reactions  . Prednisone     REACTION: unspecified  . Sulfamethoxazole     REACTION: unspecified    Family History  Problem Relation Age of Onset  .  Coronary artery disease Other   . Stroke Other     (be sure to complete)  Prior to Admission medications   Medication Sig Start Date End Date Taking? Authorizing Provider  allopurinol (ZYLOPRIM) 300 MG tablet Take 150 mg by mouth daily.   Yes Historical Provider, MD  ALPRAZolam (XANAX) 0.25 MG tablet Take 0.25 mg by mouth at bedtime.    Yes Historical Provider, MD  diltiazem (CARDIZEM CD) 300 MG 24 hr capsule Take 300 mg by mouth daily.   Yes Historical Provider, MD  donepezil (ARICEPT) 10 MG tablet Take 10 mg by mouth at bedtime.   Yes Historical Provider, MD  gabapentin (NEURONTIN) 100 MG capsule Take 100 mg by mouth at bedtime.   Yes Historical Provider, MD  loratadine (CLARITIN) 10 MG tablet Take 10 mg by mouth daily.   Yes Historical Provider, MD  metoprolol succinate (TOPROL-XL) 50 MG 24 hr tablet Take 50 mg by mouth 2 (two) times daily. Take with or immediately following a meal.   Yes Historical Provider, MD  oseltamivir (TAMIFLU) 75 MG capsule Take 75 mg by mouth 2 (two) times daily. For 5 days(started on 04/25/13)   Yes Historical Provider, MD  pantoprazole (PROTONIX) 40 MG tablet Take 40 mg by mouth 2 (two) times daily.   Yes Historical Provider, MD  predniSONE (DELTASONE) 5 MG tablet Take 5 mg by mouth daily.   Yes Historical Provider, MD  sertraline (ZOLOFT) 100 MG tablet Take 100 mg by mouth daily.   Yes Historical Provider, MD  warfarin (COUMADIN) 5 MG tablet Take 5 mg by mouth daily.     Yes Historical  Provider, MD   Physical Exam: Filed Vitals:   04/26/13 1450  BP: 143/72  Pulse: 104  Temp: 97.5 F (36.4 C)  Resp: 22     General:  alert  Eyes: EOM-i, perrla   ENT: no oral ulcers   Neck: supple   Cardiovascular: s1,s2 rrr  Respiratory: LL rales   Abdomen: soft, nt, nd   Skin: no rash  Musculoskeletal: no LE edema  Psychiatric: no hallucinations   Neurologic: CN 2-12 intact; motor 2/5 LE chronic   Labs on Admission:  Basic Metabolic Panel:  Recent  Labs Lab 04/26/13 1523  NA 143  K 3.0*  CL 99  CO2 30  GLUCOSE 173*  BUN 12  CREATININE 0.90  CALCIUM 8.8   Liver Function Tests: No results found for this basename: AST, ALT, ALKPHOS, BILITOT, PROT, ALBUMIN,  in the last 168 hours No results found for this basename: LIPASE, AMYLASE,  in the last 168 hours No results found for this basename: AMMONIA,  in the last 168 hours CBC:  Recent Labs Lab 04/26/13 1523  WBC 2.6*  NEUTROABS 1.7  HGB 11.5*  HCT 32.7*  MCV 86.3  PLT 125*   Cardiac Enzymes: No results found for this basename: CKTOTAL, CKMB, CKMBINDEX, TROPONINI,  in the last 168 hours  BNP (last 3 results) No results found for this basename: PROBNP,  in the last 8760 hours CBG: No results found for this basename: GLUCAP,  in the last 168 hours  Radiological Exams on Admission: Dg Chest Port 1 View  04/26/2013   CLINICAL DATA:  Cough, wheezing.  EXAM: PORTABLE CHEST - 1 VIEW  COMPARISON:  August 07, 2010.  FINDINGS: Stable cardiomediastinal silhouette. No pleural effusion or pneumothorax is noted. No acute pulmonary disease is noted. Bony thorax is intact.  IMPRESSION: No acute cardiopulmonary abnormality seen.   Electronically Signed   By: Roque Lias M.D.   On: 04/26/2013 15:50    EKG: Independently reviewed. Not done   Assessment/Plan Principal Problem:   Influenza Active Problems:   Atrial fibrillation   Shortness of breath   UTI (lower urinary tract infection)   Hypokalemia   URI (upper respiratory infection)  80 y.o. female with PMH of HTN, HPL, COPD, a fib on coumadin,  spinal DJD, h/o CVA, leg weakness on wheelchair presented with progressively worsening SOB, productive cough, fever, chills, diarrhea  1. COPD exacerbation/acute bronchitis, influenza; CXR: no clear infiltrate  -cont supportive care, IV atx, bronchodilators, inhaled steroids; oxygen, tamiflu  2. Diarrhea likely due to viral illness; h/o IBS; exam no s/s of acute abdomen  -IVF,  antiemetics, pain control  3. Hypo K likely GI loss; replace recheck in AM   4. A fib RVR on coumadin; echo (2011): LVEF 55%; cont coumadin per pharmacy; resume BB, titrate per response   5. UTI, cont atx, f/u cultures  6. Mild neutropenia likely due to viral illness; cont monitoring   None;  if consultant consulted, please document name and whether formally or informally consulted  Code Status: full (must indicate code status--if unknown or must be presumed, indicate so) Family Communication: d/w patient (indicate person spoken with, if applicable, with phone number if by telephone) Disposition Plan: NH in 24-48 hours if stable  (indicate anticipated LOS)  Time spent: >35 minutes   Shelia Thompson [facility] Pager [phone]  If 7PM-7AM, please contact night-coverage [email or web address] Password [email or web address] 04/26/2013, 5:09 PM

## 2013-04-26 NOTE — ED Notes (Signed)
edp in with patient

## 2013-04-26 NOTE — ED Notes (Signed)
Diagnosed with influenza type a Tuesday.  Wheezing and sob today.

## 2013-04-26 NOTE — ED Provider Notes (Signed)
CSN: [id number]     Arrival date & time 04/26/13  1445 History  This chart was scribed for Charles B. Bernette Mayers, MD by Quintella Reichert, ED scribe.  This patient was seen in room APA19/APA19 and the patient's care was started at 2:54 PM.   Chief Complaint  Patient presents with  . Shortness of Breath  . Wheezing  . Influenza    The history is provided by the patient. No language interpreter was used.    HPI Comments: Shelia Thompson is a 80 y.o. female with h/o COPD, CHF, A-fib, chronic renal insufficiency, stroke, HTN, and recent influenza diagnosis who presents to the Emergency Department complaining of two days of worsening SOB with associated wheezing, diarrhea, and fever.  Pt was diagnosed with influenza 2 days ago and placed on Tamiflu yesterday.  She states that since then her "hacking" cough has grown worse and she has become increasingly short of breath.  She also complains of intermittent fevers with her last fever yesterday.  ED temperature is 97.5 F.  Pt also states she has had a large amount of diarrhea and states that whenever she drinks fluid "it goes right through me."  She also complains of some post-tussive emesis but denies any other vomiting.  Pt was placed on breathing treatment on arrival which she states has relieved her SOB slightly.  She is not normally on home oxygen but has been using it since her current illness. She also had labs done recently which appeared to show UTI, although recently treated for same and not currently having urinary symptoms.    Past Medical History  Diagnosis Date  . Unspecified essential hypertension   . Overweight   . Heart failure, diastolic, chronic   . Atrial fibrillation   . COPD (chronic obstructive pulmonary disease)   . Chronic renal insufficiency   . Stroke     hx    Past Surgical History  Procedure Laterality Date  . Back surgery    . Cholecystectomy    . Total abdominal hysterectomy w/ bilateral salpingoophorectomy       Family History  Problem Relation Age of Onset  . Coronary artery disease Other   . Stroke Other     History  Substance Use Topics  . Smoking status: Former Games developer  . Smokeless tobacco: Not on file  . Alcohol Use: No    OB History   Grav Para Term Preterm Abortions TAB SAB Ect Mult Living                  Review of Systems A complete 10 system review of systems was obtained and all systems are negative except as noted in the HPI and PMH.    Allergies  Prednisone and Sulfamethoxazole  Home Medications   Current Outpatient Rx  Name  Route  Sig  Dispense  Refill  . allopurinol (ZYLOPRIM) 300 MG tablet   Oral   Take 300 mg by mouth daily.           Marland Kitchen ALPRAZolam (XANAX) 0.25 MG tablet   Oral   Take 0.25 mg by mouth 2 (two) times daily as needed.           . Calcium Carbonate-Vitamin D (CALTRATE 600+D) 600-400 MG-UNIT per tablet   Oral   Take 1 tablet by mouth 2 (two) times daily.           Marland Kitchen diltiazem (CARDIZEM CD) 240 MG 24 hr capsule   Oral   Take 240 mg  by mouth daily.           . furosemide (LASIX) 40 MG tablet   Oral   Take 40 mg by mouth 2 (two) times daily.           Marland Kitchen ketoconazole (NIZORAL) 2 % shampoo   Topical   Apply topically 2 (two) times daily.           . metoprolol (LOPRESSOR) 50 MG tablet   Oral   Take 50 mg by mouth 2 (two) times daily.           . Multiple Vitamins-Minerals (MULTIVITAL) tablet   Oral   Take 1 tablet by mouth daily.           Marland Kitchen omeprazole (PRILOSEC) 20 MG capsule   Oral   Take 20 mg by mouth daily.           Marland Kitchen oxyCODONE-acetaminophen (PERCOCET) 10-325 MG per tablet   Oral   Take 1 tablet by mouth 3 (three) times daily as needed.           . potassium chloride (MICRO-K) 10 MEQ CR capsule   Oral   Take 10 mEq by mouth 2 (two) times daily.           . traZODone (DESYREL) 100 MG tablet   Oral   Take 100 mg by mouth daily.           Marland Kitchen warfarin (COUMADIN) 5 MG tablet   Oral   Take 5 mg  by mouth daily.            BP 143/72  Pulse 104  Temp(Src) 97.5 F (36.4 C) (Oral)  Resp 22  SpO2 94% (room air sat measured at 89%)  Physical Exam  Nursing note and vitals reviewed. Constitutional: She is oriented to person, place, and time. She appears well-developed and well-nourished.  HENT:  Head: Normocephalic and atraumatic.  Eyes: EOM are normal. Pupils are equal, round, and reactive to light.  Neck: Normal range of motion. Neck supple.  Cardiovascular: Normal rate, normal heart sounds and intact distal pulses.   Pulmonary/Chest: Effort normal. She has wheezes.  Abdominal: Bowel sounds are normal. She exhibits no distension. There is no tenderness.  Musculoskeletal: Normal range of motion. She exhibits no edema and no tenderness.  Neurological: She is alert and oriented to person, place, and time. She has normal strength. No cranial nerve deficit or sensory deficit.  Skin: Skin is warm and dry. No rash noted.  Psychiatric: She has a normal mood and affect.    ED Course  Procedures (including critical care time)  DIAGNOSTIC STUDIES: Oxygen Saturation is 89% on room air, low by my interpretation.    COORDINATION OF CARE: 3:03 PM-Discussed treatment plan which includes CXR, labs, and breathing treatment with pt at bedside and pt agreed to plan.    Labs Review Labs Reviewed  CBC WITH DIFFERENTIAL - Abnormal; Notable for the following:    WBC 2.6 (*)    RBC 3.79 (*)    Hemoglobin 11.5 (*)    HCT 32.7 (*)    RDW 19.2 (*)    Platelets 125 (*)    Lymphs Abs 0.6 (*)    All other components within normal limits  BASIC METABOLIC PANEL - Abnormal; Notable for the following:    Potassium 3.0 (*)    Glucose, Bld 173 (*)    GFR calc non Af Amer 58 (*)    GFR calc Af Amer 68 (*)  All other components within normal limits  PROTIME-INR - Abnormal; Notable for the following:    Prothrombin Time 30.6 (*)    INR 3.07 (*)    All other components within normal limits   URINALYSIS, ROUTINE W REFLEX MICROSCOPIC - Abnormal; Notable for the following:    Specific Gravity, Urine >1.030 (*)    Bilirubin Urine SMALL (*)    Protein, ur 100 (*)    Nitrite POSITIVE (*)    All other components within normal limits  URINE MICROSCOPIC-ADD ON - Abnormal; Notable for the following:    Squamous Epithelial / LPF FEW (*)    All other components within normal limits  CLOSTRIDIUM DIFFICILE BY PCR  LACTIC ACID, PLASMA  CBC  BASIC METABOLIC PANEL  PROTIME-INR    Imaging Review Dg Chest Port 1 View  04/26/2013   CLINICAL DATA:  Cough, wheezing.  EXAM: PORTABLE CHEST - 1 VIEW  COMPARISON:  August 07, 2010.  FINDINGS: Stable cardiomediastinal silhouette. No pleural effusion or pneumothorax is noted. No acute pulmonary disease is noted. Bony thorax is intact.  IMPRESSION: No acute cardiopulmonary abnormality seen.   Electronically Signed   By: Roque LiasJames  Green M.D.   On: 04/26/2013 15:50      MDM   1. Influenza   2. COPD (chronic obstructive pulmonary disease)   3. Atrial fibrillation   4. Chronic diastolic heart failure   5. Hypokalemia     Pt with cough, wheezing and hypoxia after flu diagnosis. Will recheck CXR to eval for evolving PNA but also recently had course of Zithromax.   Labs and imaging reviewed. No new infiltrate. Admit for bronchitis/influenza.  I personally performed the services described in this documentation, which was scribed in my presence. The recorded information has been reviewed and is accurate.      Charles B. Bernette MayersSheldon, MD 04/26/13 2208

## 2013-04-27 ENCOUNTER — Encounter (HOSPITAL_COMMUNITY): Payer: Self-pay | Admitting: *Deleted

## 2013-04-27 DIAGNOSIS — N390 Urinary tract infection, site not specified: Secondary | ICD-10-CM

## 2013-04-27 DIAGNOSIS — R0602 Shortness of breath: Secondary | ICD-10-CM

## 2013-04-27 LAB — BASIC METABOLIC PANEL
BUN: 11 mg/dL (ref 6–23)
CO2: 31 mEq/L (ref 19–32)
Calcium: 8.6 mg/dL (ref 8.4–10.5)
Chloride: 101 mEq/L (ref 96–112)
Creatinine, Ser: 0.88 mg/dL (ref 0.50–1.10)
GFR, EST AFRICAN AMERICAN: 70 mL/min — AB (ref >90)
GFR, EST NON AFRICAN AMERICAN: 60 mL/min — AB (ref >90)
Glucose, Bld: 111 mg/dL — ABNORMAL HIGH (ref 70–99)
Potassium: 2.8 mEq/L — CL (ref 3.7–5.3)
SODIUM: 144 meq/L (ref 137–147)

## 2013-04-27 LAB — PROTIME-INR
INR: 3.31 — AB (ref 0.00–1.49)
Prothrombin Time: 32.4 seconds — ABNORMAL HIGH (ref 11.6–15.2)

## 2013-04-27 LAB — CBC
HCT: 29.2 % — ABNORMAL LOW (ref 36.0–46.0)
Hemoglobin: 10.4 g/dL — ABNORMAL LOW (ref 12.0–15.0)
MCH: 30.8 pg (ref 26.0–34.0)
MCHC: 35.6 g/dL (ref 30.0–36.0)
MCV: 86.4 fL (ref 78.0–100.0)
Platelets: 109 10*3/uL — ABNORMAL LOW (ref 150–400)
RBC: 3.38 MIL/uL — AB (ref 3.87–5.11)
RDW: 19.6 % — AB (ref 11.5–15.5)
WBC: 2.9 10*3/uL — ABNORMAL LOW (ref 4.0–10.5)

## 2013-04-27 MED ORDER — POTASSIUM CHLORIDE 10 MEQ/100ML IV SOLN
10.0000 meq | INTRAVENOUS | Status: AC
  Administered 2013-04-27 (×4): 10 meq via INTRAVENOUS
  Filled 2013-04-27: qty 100

## 2013-04-27 MED ORDER — POTASSIUM CHLORIDE CRYS ER 20 MEQ PO TBCR
40.0000 meq | EXTENDED_RELEASE_TABLET | Freq: Two times a day (BID) | ORAL | Status: DC
  Administered 2013-04-27 – 2013-04-28 (×3): 40 meq via ORAL
  Filled 2013-04-27 (×3): qty 2

## 2013-04-27 NOTE — Progress Notes (Signed)
ANTICOAGULATION CONSULT NOTE  Pharmacy Consult for Warfarin Indication: atrial fibrillation  Allergies  Allergen Reactions  . Prednisone     REACTION: unspecified  . Sulfamethoxazole     REACTION: unspecified    Patient Measurements: Weight: 252 lb 6.8 oz (114.5 kg)  Vital Signs: Temp: 97.4 F (36.3 C) (01/16 0640) Temp src: Oral (01/16 0640) BP: 104/71 mmHg (01/16 0640) Pulse Rate: 83 (01/16 0640)  Labs:  Recent Labs  04/26/13 1523 04/27/13 0622  HGB 11.5* 10.4*  HCT 32.7* 29.2*  PLT 125* 109*  LABPROT 30.6* 32.4*  INR 3.07* 3.31*  CREATININE 0.90 0.88    The CrCl is unknown because both a height and weight (above a minimum accepted value) are required for this calculation.   Medical History: Past Medical History  Diagnosis Date  . Unspecified essential hypertension   . Overweight   . Heart failure, diastolic, chronic   . Atrial fibrillation   . COPD (chronic obstructive pulmonary disease)   . Chronic renal insufficiency   . Stroke     hx    Medications:  Prescriptions prior to admission  Medication Sig Dispense Refill  . allopurinol (ZYLOPRIM) 300 MG tablet Take 150 mg by mouth daily.      Marland Kitchen. ALPRAZolam (XANAX) 0.25 MG tablet Take 0.25 mg by mouth at bedtime.       Marland Kitchen. diltiazem (CARDIZEM CD) 300 MG 24 hr capsule Take 300 mg by mouth daily.      Marland Kitchen. donepezil (ARICEPT) 10 MG tablet Take 10 mg by mouth at bedtime.      . gabapentin (NEURONTIN) 100 MG capsule Take 100 mg by mouth at bedtime.      Marland Kitchen. loratadine (CLARITIN) 10 MG tablet Take 10 mg by mouth daily.      . metoprolol succinate (TOPROL-XL) 50 MG 24 hr tablet Take 50 mg by mouth 2 (two) times daily. Take with or immediately following a meal.      . oseltamivir (TAMIFLU) 75 MG capsule Take 75 mg by mouth 2 (two) times daily. For 5 days(started on 04/25/13)      . pantoprazole (PROTONIX) 40 MG tablet Take 40 mg by mouth 2 (two) times daily.      . predniSONE (DELTASONE) 5 MG tablet Take 5 mg by mouth  daily.      . sertraline (ZOLOFT) 100 MG tablet Take 100 mg by mouth daily.      Marland Kitchen. warfarin (COUMADIN) 5 MG tablet Take 5 mg by mouth daily.          Assessment: 80 yo F admitted from NH on Coumadin 5mg  po daily for Afib.  INR is elevated on admission & has risen further today.   This is likely due to infectious process and patient has been started on Levaquin.   No bleeding noted.   Goal of Therapy:  INR 2-3    Plan:  Hold Coumadin today INR daily  Elson ClanLilliston, Alajah Witman Michelle 04/27/2013,9:26 AM

## 2013-04-27 NOTE — Progress Notes (Signed)
Utilization Review Complete  

## 2013-04-27 NOTE — Progress Notes (Signed)
[facility] PROGRESS NOTE  Shelia Thompson ZOX:[id number] DOB: [birth year] DOA: 04/26/2013 PCP: Donzetta Sprung, MD  Assessment/Plan: Principal Problem:  Influenza  Active Problems:  Atrial fibrillation  Shortness of breath  UTI (lower urinary tract infection)  Hypokalemia  URI (upper respiratory infection)   80 y.o. female with PMH of HTN, HPL, COPD, a fib on coumadin, spinal DJD, h/o CVA, leg weakness on wheelchair presented with progressively worsening SOB, productive cough, fever, chills, diarrhea   1. COPD exacerbation/acute bronchitis, +influenza; CXR: no clear infiltrate  -cont supportive care, IV atx, bronchodilators, inhaled steroids; oxygen, tamiflu   2. Diarrhea likely due to viral illness; h/o IBS; exam no s/s of acute abdomen  -c diff neg; cont IVF, antiemetics, pain control   3. Hypo K likely GI loss; replace recheck in AM   4. A fib RVR on coumadin; echo (2011): LVEF 55%; cont coumadin per pharmacy; resume BB, titrate per response  -repeat echo;   5. UTI, cont atx, f/u cultures   6. Mild neutropenia likely due to viral illness; cont monitoring  None; if consultant consulted, please document name and whether formally or informally consulted   Code Status: full Family Communication: d/w patient, updated her daughter at [phone], [phone] (indicate person spoken with, relationship, and if by phone, the number) Disposition Plan: NH in 2-3 days    Consultants:  None   Procedures:  None   Antibiotics:  Levofloxacin 1/15<<<   (indicate start date, and stop date if known)  HPI/Subjective: alert  Objective: Filed Vitals:   04/27/13 0640  BP: 104/71  Pulse: 83  Temp: 97.4 F (36.3 C)  Resp: 20   No intake or output data in the 24 hours ending 04/27/13 0931 Filed Weights   04/27/13 0700  Weight: 114.5 kg (252 lb 6.8 oz)    Exam:   General:  alert  Cardiovascular: s1,s2 rr  Respiratory: LL rales   Abdomen: soft, nt, nd    Musculoskeletal: no LE edema   Data Reviewed: Basic Metabolic Panel:  Recent Labs Lab 04/26/13 1523 04/27/13 0622  NA 143 144  K 3.0* 2.8*  CL 99 101  CO2 30 31  GLUCOSE 173* 111*  BUN 12 11  CREATININE 0.90 0.88  CALCIUM 8.8 8.6   Liver Function Tests: No results found for this basename: AST, ALT, ALKPHOS, BILITOT, PROT, ALBUMIN,  in the last 168 hours No results found for this basename: LIPASE, AMYLASE,  in the last 168 hours No results found for this basename: AMMONIA,  in the last 168 hours CBC:  Recent Labs Lab 04/26/13 1523 04/27/13 0622  WBC 2.6* 2.9*  NEUTROABS 1.7  --   HGB 11.5* 10.4*  HCT 32.7* 29.2*  MCV 86.3 86.4  PLT 125* 109*   Cardiac Enzymes: No results found for this basename: CKTOTAL, CKMB, CKMBINDEX, TROPONINI,  in the last 168 hours BNP (last 3 results) No results found for this basename: PROBNP,  in the last 8760 hours CBG: No results found for this basename: GLUCAP,  in the last 168 hours  Recent Results (from the past 240 hour(s))  CLOSTRIDIUM DIFFICILE BY PCR     Status: None   Collection Time    04/26/13  5:18 PM      Result Value Range Status   C difficile by pcr NEGATIVE  NEGATIVE Final     Studies: Dg Chest Port 1 View  04/26/2013   CLINICAL DATA:  Cough, wheezing.  EXAM: PORTABLE CHEST - 1 VIEW  COMPARISON:  August 07, 2010.  FINDINGS: Stable cardiomediastinal silhouette. No pleural effusion or pneumothorax is noted. No acute pulmonary disease is noted. Bony thorax is intact.  IMPRESSION: No acute cardiopulmonary abnormality seen.   Electronically Signed   By: Roque LiasJames  Green M.D.   On: 04/26/2013 15:50    Scheduled Meds: . albuterol  2.5 mg Nebulization Q4H  . allopurinol  150 mg Oral Daily  . ALPRAZolam  0.25 mg Oral QHS  . diltiazem  300 mg Oral Daily  . donepezil  10 mg Oral QHS  . gabapentin  100 mg Oral QHS  . levofloxacin (LEVAQUIN) IV  500 mg Intravenous Q24H  . loratadine  10 mg Oral Daily  . metoprolol succinate   50 mg Oral BID  . oseltamivir  75 mg Oral BID  . pantoprazole  40 mg Oral BID  . potassium chloride  10 mEq Intravenous Q1 Hr x 4  . potassium chloride  40 mEq Oral BID  . sertraline  100 mg Oral Daily  . sodium chloride  3 mL Intravenous Q12H  . Warfarin - Pharmacist Dosing Inpatient   Does not apply q1800   Continuous Infusions: . 0.9 % NaCl with KCl 20 mEq / L 75 mL/hr at 04/26/13 2150    Principal Problem:   Influenza Active Problems:   Atrial fibrillation   Shortness of breath   UTI (lower urinary tract infection)   Hypokalemia   URI (upper respiratory infection)   COPD (chronic obstructive pulmonary disease)    Time spent:>35 minutes     Esperanza SheetsBURIEV, Salle Brandle N  [facility] Pager [phone]. If 7PM-7AM, please contact night-coverage at [email or web address], password [facility] 04/27/2013, 9:31 AM  LOS: 1 day

## 2013-04-27 NOTE — Progress Notes (Signed)
Patient has a 4 x 7 cm area of redness to sacrum.  Sacral dressing applied.  Patient has redness to left heel, dressings applied.  Areas present on admission.

## 2013-04-27 NOTE — Progress Notes (Signed)
Nutrition Brief Note  Patient identified on the Malnutrition Screening Tool (MST) Report  Wt Readings from Last 15 Encounters:  04/27/13 252 lb 6.8 oz (114.5 kg)  02/26/10 254 lb (115.214 kg)  10/30/09 259 lb (117.482 kg)  10/06/09 259 lb (117.482 kg)  06/09/09 269 lb (122.018 kg)    Body mass index is 38.93 kg/(m^2). Patient meets criteria for obesity class 2 based on current BMI. Weight hx stable.  Current diet order is Regular. Labs and medications reviewed.   No nutrition interventions warranted at this time. If nutrition issues arise, please consult RD.   Royann ShiversLynn Ikeem Cleckler MS,RD,CSG,LDN Office: [phone] Pager: [phone]

## 2013-04-27 NOTE — Clinical Social Work Psychosocial (Signed)
Clinical Social Work Department BRIEF PSYCHOSOCIAL ASSESSMENT 04/27/2013  Patient:  Shelia Thompson, Shelia Thompson     Account Number:  [id number]     Admit date:  04/26/2013  Clinical Social Worker:  Norina Buzzard Intern  Date/Time:  04/27/2013 08:26 AM  Referred by:  CSW  Date Referred:  04/27/2013 Referred for  SNF Placement   Other Referral:   Interview type:  Patient Other interview type:    PSYCHOSOCIAL DATA Living Status:  FACILITY Admitted from facility:  [facility] Level of care:  [place] Primary support name:  Helene Kelp or Lennette Bihari Primary support relationship to patient:  CHILD, ADULT Degree of support available:   very supportive    CURRENT CONCERNS Current Concerns  Post-Acute Placement   Other Concerns:    SOCIAL WORK ASSESSMENT / PLAN Met with pt at bedside. Pt alert and oriented X4. Pt reports she has been at [facility] for about 3 years. She explained that she is a high fall risk pt and requires assistance with most ADLs because she is bed ridden. Pt stated that her son, Lennette Bihari and daughter, Helene Kelp are her best support and come to see her almost every day. Pt's plan is to return to [facility] when d/c.    Spoke with Annice Needy at the facility. She confirms pt has been at [facility] since December 2011. Facility is agreeable to weekend d/c if appropriate. Per Cila, RN pt requires a lift and moderate assistance with ADLs. She reports pt's daughter is involved and comes to visit her frequently.   Assessment/plan status:  Psychosocial Support/Ongoing Assessment of Needs Other assessment/ plan:   Information/referral to community resources:    PATIENT'S/FAMILY'S RESPONSE TO PLAN OF CARE: Pt and facility agreeable to return at d/c. Will continue to follow.     Delfina Redwood BSW Intern

## 2013-04-27 NOTE — Progress Notes (Signed)
Patient with critical lab K 2.8. Dr. York SpanielBuriev notified

## 2013-04-28 DIAGNOSIS — I517 Cardiomegaly: Secondary | ICD-10-CM

## 2013-04-28 DIAGNOSIS — J069 Acute upper respiratory infection, unspecified: Secondary | ICD-10-CM

## 2013-04-28 LAB — BASIC METABOLIC PANEL
BUN: 7 mg/dL (ref 6–23)
CO2: 29 meq/L (ref 19–32)
Calcium: 8.7 mg/dL (ref 8.4–10.5)
Chloride: 103 mEq/L (ref 96–112)
Creatinine, Ser: 0.88 mg/dL (ref 0.50–1.10)
GFR calc Af Amer: 70 mL/min — ABNORMAL LOW (ref >90)
GFR, EST NON AFRICAN AMERICAN: 60 mL/min — AB (ref >90)
Glucose, Bld: 110 mg/dL — ABNORMAL HIGH (ref 70–99)
Potassium: 4 mEq/L (ref 3.7–5.3)
Sodium: 144 mEq/L (ref 137–147)

## 2013-04-28 LAB — GLUCOSE, CAPILLARY: GLUCOSE-CAPILLARY: 97 mg/dL (ref 70–99)

## 2013-04-28 LAB — PROTIME-INR
INR: 3.16 — ABNORMAL HIGH (ref 0.00–1.49)
Prothrombin Time: 31.3 seconds — ABNORMAL HIGH (ref 11.6–15.2)

## 2013-04-28 MED ORDER — LEVOFLOXACIN 750 MG PO TABS
750.0000 mg | ORAL_TABLET | Freq: Every day | ORAL | Status: DC
  Administered 2013-04-28 – 2013-05-02 (×5): 750 mg via ORAL
  Filled 2013-04-28 (×5): qty 1

## 2013-04-28 NOTE — Progress Notes (Signed)
ANTICOAGULATION CONSULT NOTE  Pharmacy Consult for Warfarin Indication: atrial fibrillation  Allergies  Allergen Reactions  . Prednisone     REACTION: unspecified  . Sulfamethoxazole     REACTION: unspecified    Patient Measurements: Height: 5\' 7"  (170.2 cm) Weight: 252 lb 10.4 oz (114.6 kg) IBW/kg (Calculated) : 61.6  Vital Signs: Temp: 97.4 F (36.3 C) (01/16 2256) Temp src: Oral (01/16 2256) BP: 128/79 mmHg (01/16 2256) Pulse Rate: 87 (01/16 2256)  Labs:  Recent Labs  04/26/13 1523 04/27/13 0622 04/28/13 0522  HGB 11.5* 10.4*  --   HCT 32.7* 29.2*  --   PLT 125* 109*  --   LABPROT 30.6* 32.4* 31.3*  INR 3.07* 3.31* 3.16*  CREATININE 0.90 0.88 0.88    Estimated Creatinine Clearance: 65.5 ml/min (by C-G formula based on Cr of 0.88).   Medical History: Past Medical History  Diagnosis Date  . Unspecified essential hypertension   . Overweight   . Heart failure, diastolic, chronic   . Atrial fibrillation   . COPD (chronic obstructive pulmonary disease)   . Chronic renal insufficiency   . Stroke     hx    Medications:  Prescriptions prior to admission  Medication Sig Dispense Refill  . allopurinol (ZYLOPRIM) 300 MG tablet Take 150 mg by mouth daily.      Marland Kitchen ALPRAZolam (XANAX) 0.25 MG tablet Take 0.25 mg by mouth at bedtime.       Marland Kitchen diltiazem (CARDIZEM CD) 300 MG 24 hr capsule Take 300 mg by mouth daily.      Marland Kitchen donepezil (ARICEPT) 10 MG tablet Take 10 mg by mouth at bedtime.      . gabapentin (NEURONTIN) 100 MG capsule Take 100 mg by mouth at bedtime.      Marland Kitchen loratadine (CLARITIN) 10 MG tablet Take 10 mg by mouth daily.      . metoprolol succinate (TOPROL-XL) 50 MG 24 hr tablet Take 50 mg by mouth 2 (two) times daily. Take with or immediately following a meal.      . oseltamivir (TAMIFLU) 75 MG capsule Take 75 mg by mouth 2 (two) times daily. For 5 days(started on 04/25/13)      . pantoprazole (PROTONIX) 40 MG tablet Take 40 mg by mouth 2 (two) times daily.       . predniSONE (DELTASONE) 5 MG tablet Take 5 mg by mouth daily.      . sertraline (ZOLOFT) 100 MG tablet Take 100 mg by mouth daily.      Marland Kitchen warfarin (COUMADIN) 5 MG tablet Take 5 mg by mouth daily.          Assessment: 80 yo F admitted from NH on Coumadin 5mg  po daily for Afib.  INR is elevated on admission.  It remains >3 today despite no doses since admission.   This is likely due to infectious process and patient has been started on Levaquin.   No bleeding noted.   Goal of Therapy:  INR 2-3   Plan:  Hold Coumadin today INR daily Change Levaquin to po per P&T policy (see below)  Apolinar Bero, Mercy Riding 04/28/2013,8:49 AM  PHARMACIST - PHYSICIAN COMMUNICATION CONCERNING: Antibiotic IV to Oral Route Change Policy  RECOMMENDATION: This patient is receiving Levaquin by the intravenous route.  Based on criteria approved by the Pharmacy and Therapeutics Committee, the antibiotic(s) is/are being converted to the equivalent oral dose form(s).  DESCRIPTION: These criteria include:  Patient being treated for a respiratory tract infection, urinary tract infection, cellulitis  or clostridium difficile associated diarrhea if on metronidazole  The patient is not neutropenic and does not exhibit a GI malabsorption state  The patient is eating (either orally or via tube) and/or has been taking other orally administered medications for a least 24 hours  The patient is improving clinically and has a Tmax < 100.5

## 2013-04-28 NOTE — Progress Notes (Signed)
[facility] PROGRESS NOTE  Dotsie Gillette WUJ:[id number] DOB: November 10, 1929 DOA: 04/26/2013 PCP: Donzetta Sprung, MD  Assessment/Plan: Principal Problem:  Influenza  Active Problems:  Atrial fibrillation  Shortness of breath  UTI (lower urinary tract infection)  Hypokalemia  URI (upper respiratory infection)   80 y.o. female with PMH of HTN, HPL, COPD, a fib on coumadin, spinal DJD, h/o CVA, leg weakness on wheelchair presented with progressively worsening SOB, productive cough, fever, chills, diarrhea   1. COPD exacerbation/acute bronchitis, +influenza; CXR: no clear infiltrate  -cont supportive care, IV atx, bronchodilators, inhaled steroids; oxygen, tamiflu   2. Diarrhea likely due to viral illness; h/o IBS; exam no s/s of acute abdomen  -improved; c diff neg; cont gentle  IVF, antiemetics, pain control   3. Hypo K likely GI loss; replace recheck in AM   4. A fib RVR on coumadin; echo (2011): LVEF 55%; cont coumadin per pharmacy; resume BB, titrate per response  -repeat echo: LVEF 55%   5. UTI, cont atx, f/u cultures   6. Mild neutropenia likely due to viral illness; cont monitoring  None; if consultant consulted, please document name and whether formally or informally consulted   Code Status: full Family Communication: d/w patient, updated her daughter at [phone], [phone] (indicate person spoken with, relationship, and if by phone, the number) Disposition Plan: NH in 2-3 days    Consultants:  None   Procedures:  None   Antibiotics:  Levofloxacin 1/15<<<   (indicate start date, and stop date if known)  HPI/Subjective: alert  Objective: Filed Vitals:   04/27/13 2256  BP: 128/79  Pulse: 87  Temp: 97.4 F (36.3 C)  Resp: 20    Intake/Output Summary (Last 24 hours) at 04/28/13 1127 Last data filed at 04/28/13 0900  Gross per 24 hour  Intake 3092.5 ml  Output      0 ml  Net 3092.5 ml   Filed Weights   04/27/13 0700 04/27/13 1159  Weight:  114.5 kg (252 lb 6.8 oz) 114.6 kg (252 lb 10.4 oz)    Exam:   General:  alert  Cardiovascular: s1,s2 rr  Respiratory: LL rales   Abdomen: soft, nt, nd   Musculoskeletal: no LE edema   Data Reviewed: Basic Metabolic Panel:  Recent Labs Lab 04/26/13 1523 04/27/13 0622 04/28/13 0522  NA 143 144 144  K 3.0* 2.8* 4.0  CL 99 101 103  CO2 30 31 29   GLUCOSE 173* 111* 110*  BUN 12 11 7   CREATININE 0.90 0.88 0.88  CALCIUM 8.8 8.6 8.7   Liver Function Tests: No results found for this basename: AST, ALT, ALKPHOS, BILITOT, PROT, ALBUMIN,  in the last 168 hours No results found for this basename: LIPASE, AMYLASE,  in the last 168 hours No results found for this basename: AMMONIA,  in the last 168 hours CBC:  Recent Labs Lab 04/26/13 1523 04/27/13 0622  WBC 2.6* 2.9*  NEUTROABS 1.7  --   HGB 11.5* 10.4*  HCT 32.7* 29.2*  MCV 86.3 86.4  PLT 125* 109*   Cardiac Enzymes: No results found for this basename: CKTOTAL, CKMB, CKMBINDEX, TROPONINI,  in the last 168 hours BNP (last 3 results) No results found for this basename: PROBNP,  in the last 8760 hours CBG: No results found for this basename: GLUCAP,  in the last 168 hours  Recent Results (from the past 240 hour(s))  CLOSTRIDIUM DIFFICILE BY PCR     Status: None   Collection Time    04/26/13  5:18 PM      Result Value Range Status   C difficile by pcr NEGATIVE  NEGATIVE Final     Studies: Dg Chest Port 1 View  04/26/2013   CLINICAL DATA:  Cough, wheezing.  EXAM: PORTABLE CHEST - 1 VIEW  COMPARISON:  August 07, 2010.  FINDINGS: Stable cardiomediastinal silhouette. No pleural effusion or pneumothorax is noted. No acute pulmonary disease is noted. Bony thorax is intact.  IMPRESSION: No acute cardiopulmonary abnormality seen.   Electronically Signed   By: Roque LiasJames  Green M.D.   On: 04/26/2013 15:50    Scheduled Meds: . albuterol  2.5 mg Nebulization Q4H  . allopurinol  150 mg Oral Daily  . ALPRAZolam  0.25 mg Oral QHS   . diltiazem  300 mg Oral Daily  . donepezil  10 mg Oral QHS  . gabapentin  100 mg Oral QHS  . levofloxacin  750 mg Oral Daily  . loratadine  10 mg Oral Daily  . metoprolol succinate  50 mg Oral BID  . oseltamivir  75 mg Oral BID  . pantoprazole  40 mg Oral BID  . potassium chloride  40 mEq Oral BID  . sertraline  100 mg Oral Daily  . sodium chloride  3 mL Intravenous Q12H  . Warfarin - Pharmacist Dosing Inpatient   Does not apply q1800   Continuous Infusions: . 0.9 % NaCl with KCl 20 mEq / L 75 mL/hr at 04/28/13 [id number]    Principal Problem:   Influenza Active Problems:   Atrial fibrillation   Shortness of breath   UTI (lower urinary tract infection)   Hypokalemia   URI (upper respiratory infection)   COPD (chronic obstructive pulmonary disease)    Time spent:>35 minutes     Esperanza SheetsBURIEV, [place] N  [facility] Pager [phone]. If 7PM-7AM, please contact night-coverage at [email or web address], password [email or web address] 04/28/2013, 11:27 AM  LOS: 2 days

## 2013-04-29 LAB — PROTIME-INR
INR: 2.83 — AB (ref 0.00–1.49)
Prothrombin Time: 28.8 seconds — ABNORMAL HIGH (ref 11.6–15.2)

## 2013-04-29 MED ORDER — GUAIFENESIN-DM 100-10 MG/5ML PO SYRP
5.0000 mL | ORAL_SOLUTION | ORAL | Status: DC | PRN
  Administered 2013-04-29 – 2013-05-04 (×7): 5 mL via ORAL
  Filled 2013-04-29 (×7): qty 5

## 2013-04-29 MED ORDER — IPRATROPIUM-ALBUTEROL 0.5-2.5 (3) MG/3ML IN SOLN
3.0000 mL | RESPIRATORY_TRACT | Status: DC
  Administered 2013-04-29 – 2013-04-30 (×8): 3 mL via RESPIRATORY_TRACT
  Filled 2013-04-29 (×9): qty 3

## 2013-04-29 MED ORDER — IPRATROPIUM BROMIDE 0.02 % IN SOLN
RESPIRATORY_TRACT | Status: AC
  Administered 2013-04-29: 0.5 mg
  Filled 2013-04-29: qty 2.5

## 2013-04-29 MED ORDER — WARFARIN SODIUM 5 MG PO TABS
5.0000 mg | ORAL_TABLET | Freq: Once | ORAL | Status: AC
  Administered 2013-04-29: 5 mg via ORAL
  Filled 2013-04-29: qty 1

## 2013-04-29 NOTE — Progress Notes (Addendum)
PT still has coarse exp wheezes , atrovent 0.5mg  is added to neb. Hopefully this will help some with her wheezing. Will change to duoneb txs. Also decreasing nebs to q4wa to allow PT to sleep some during night.

## 2013-04-29 NOTE — Progress Notes (Signed)
[facility] PROGRESS NOTE  Shelia Thompson EXB:[id number] DOB: [birth year] DOA: 04/26/2013 PCP: Donzetta SprungANIEL, TERRY, MD  Assessment/Plan: Principal Problem:  Influenza  Active Problems:  Atrial fibrillation  Shortness of breath  UTI (lower urinary tract infection)  Hypokalemia  URI (upper respiratory infection)   80 y.o. female with PMH of HTN, HPL, COPD, a fib on coumadin, spinal DJD, h/o CVA, leg weakness on wheelchair, dementia presented with progressively worsening SOB, productive cough, fever, chills, diarrhea   1. COPD exacerbation/acute bronchitis, +influenza; CXR: no clear infiltrate  -slowly improving; cont supportive care, IV atx, bronchodilators, inhaled steroids; oxygen, tamiflu   2. Diarrhea likely due to viral illness; h/o IBS; exam no s/s of acute abdomen  -improved; c diff neg; cont gentle  IVF, antiemetics, pain control   3. Hypo K likely GI loss; replace recheck in AM   4. A fib RVR on coumadin; echo (2011): LVEF 55%; cont coumadin per pharmacy; resume BB, titrate per response  -repeat echo: LVEF 55%   5. UTI, cont atx  6. Mild neutropenia likely due to viral illness; cont monitoring  None; if consultant consulted, please document name and whether formally or informally consulted   Code Status: full Family Communication: d/w patient, updated her daughter at [phone], [phone] (indicate person spoken with, relationship, and if by phone, the number) Disposition Plan: NH in 2-3 days    Consultants:  None   Procedures:  None   Antibiotics:  Levofloxacin 1/15<<<   (indicate start date, and stop date if known)  HPI/Subjective: alert  Objective: Filed Vitals:   04/29/13 0533  BP: 128/52  Pulse: 81  Temp: 97.9 F (36.6 C)  Resp: 20    Intake/Output Summary (Last 24 hours) at 04/29/13 1123 Last data filed at 04/29/13 1000  Gross per 24 hour  Intake 1913.33 ml  Output      0 ml  Net 1913.33 ml   Filed Weights   04/27/13 0700 04/27/13  1159 04/29/13 0533  Weight: 114.5 kg (252 lb 6.8 oz) 114.6 kg (252 lb 10.4 oz) 120.5 kg (265 lb 10.5 oz)    Exam:   General:  alert  Cardiovascular: s1,s2 rr  Respiratory: LL rales   Abdomen: soft, nt, nd   Musculoskeletal: no LE edema   Data Reviewed: Basic Metabolic Panel:  Recent Labs Lab 04/26/13 1523 04/27/13 0622 04/28/13 0522  NA 143 144 144  K 3.0* 2.8* 4.0  CL 99 101 103  CO2 30 31 29   GLUCOSE 173* 111* 110*  BUN 12 11 7   CREATININE 0.90 0.88 0.88  CALCIUM 8.8 8.6 8.7   Liver Function Tests: No results found for this basename: AST, ALT, ALKPHOS, BILITOT, PROT, ALBUMIN,  in the last 168 hours No results found for this basename: LIPASE, AMYLASE,  in the last 168 hours No results found for this basename: AMMONIA,  in the last 168 hours CBC:  Recent Labs Lab 04/26/13 1523 04/27/13 0622  WBC 2.6* 2.9*  NEUTROABS 1.7  --   HGB 11.5* 10.4*  HCT 32.7* 29.2*  MCV 86.3 86.4  PLT 125* 109*   Cardiac Enzymes: No results found for this basename: CKTOTAL, CKMB, CKMBINDEX, TROPONINI,  in the last 168 hours BNP (last 3 results) No results found for this basename: PROBNP,  in the last 8760 hours CBG:  Recent Labs Lab 04/28/13 2156  GLUCAP 97    Recent Results (from the past 240 hour(s))  CLOSTRIDIUM DIFFICILE BY PCR     Status: None   Collection  Time    04/26/13  5:18 PM      Result Value Range Status   C difficile by pcr NEGATIVE  NEGATIVE Final     Studies: No results found.  Scheduled Meds: . allopurinol  150 mg Oral Daily  . ALPRAZolam  0.25 mg Oral QHS  . diltiazem  300 mg Oral Daily  . donepezil  10 mg Oral QHS  . gabapentin  100 mg Oral QHS  . ipratropium-albuterol  3 mL Nebulization Q4H  . levofloxacin  750 mg Oral Daily  . loratadine  10 mg Oral Daily  . metoprolol succinate  50 mg Oral BID  . oseltamivir  75 mg Oral BID  . pantoprazole  40 mg Oral BID  . sertraline  100 mg Oral Daily  . sodium chloride  3 mL Intravenous Q12H  .  warfarin  5 mg Oral ONCE-1800  . Warfarin - Pharmacist Dosing Inpatient   Does not apply q1800   Continuous Infusions: . 0.9 % NaCl with KCl 20 mEq / L 50 mL/hr at 04/29/13 1610    Principal Problem:   Influenza Active Problems:   Atrial fibrillation   Shortness of breath   UTI (lower urinary tract infection)   Hypokalemia   URI (upper respiratory infection)   COPD (chronic obstructive pulmonary disease)    Time spent:>35 minutes     Esperanza Sheets  [facility] Pager [phone]. If 7PM-7AM, please contact night-coverage at [email or web address], password [facility] 04/29/2013, 11:23 AM  LOS: 3 days

## 2013-04-29 NOTE — Progress Notes (Signed)
ANTICOAGULATION CONSULT NOTE  Pharmacy Consult for Warfarin Indication: atrial fibrillation  Allergies  Allergen Reactions  . Prednisone     REACTION: unspecified  . Sulfamethoxazole     REACTION: unspecified    Patient Measurements: Height: 5\' 7"  (170.2 cm) Weight: 265 lb 10.5 oz (120.5 kg) IBW/kg (Calculated) : 61.6  Vital Signs: Temp: 97.9 F (36.6 C) (01/18 0533) Temp src: Oral (01/18 0533) BP: 128/52 mmHg (01/18 0533) Pulse Rate: 81 (01/18 0533)  Labs:  Recent Labs  04/26/13 1523 04/27/13 0622 04/28/13 0522 04/29/13 0546  HGB 11.5* 10.4*  --   --   HCT 32.7* 29.2*  --   --   PLT 125* 109*  --   --   LABPROT 30.6* 32.4* 31.3* 28.8*  INR 3.07* 3.31* 3.16* 2.83*  CREATININE 0.90 0.88 0.88  --     Estimated Creatinine Clearance: 67.4 ml/min (by C-G formula based on Cr of 0.88).   Medical History: Past Medical History  Diagnosis Date  . Unspecified essential hypertension   . Overweight   . Heart failure, diastolic, chronic   . Atrial fibrillation   . COPD (chronic obstructive pulmonary disease)   . Chronic renal insufficiency   . Stroke     hx    Medications:  Prescriptions prior to admission  Medication Sig Dispense Refill  . allopurinol (ZYLOPRIM) 300 MG tablet Take 150 mg by mouth daily.      Marland Kitchen. ALPRAZolam (XANAX) 0.25 MG tablet Take 0.25 mg by mouth at bedtime.       Marland Kitchen. diltiazem (CARDIZEM CD) 300 MG 24 hr capsule Take 300 mg by mouth daily.      Marland Kitchen. donepezil (ARICEPT) 10 MG tablet Take 10 mg by mouth at bedtime.      . gabapentin (NEURONTIN) 100 MG capsule Take 100 mg by mouth at bedtime.      Marland Kitchen. loratadine (CLARITIN) 10 MG tablet Take 10 mg by mouth daily.      . metoprolol succinate (TOPROL-XL) 50 MG 24 hr tablet Take 50 mg by mouth 2 (two) times daily. Take with or immediately following a meal.      . oseltamivir (TAMIFLU) 75 MG capsule Take 75 mg by mouth 2 (two) times daily. For 5 days(started on 04/25/13)      . pantoprazole (PROTONIX) 40 MG  tablet Take 40 mg by mouth 2 (two) times daily.      . predniSONE (DELTASONE) 5 MG tablet Take 5 mg by mouth daily.      . sertraline (ZOLOFT) 100 MG tablet Take 100 mg by mouth daily.      Marland Kitchen. warfarin (COUMADIN) 5 MG tablet Take 5 mg by mouth daily.          Assessment: 80 yo F admitted from NH on Coumadin 5mg  po daily for Afib.  INR was elevated on admission, but is finally <3 today.  Patient remains on Levaquin.   No bleeding noted.   Goal of Therapy:  INR 2-3   Plan:  Resume Coumadin 5mg  po x1 today INR daily  Elson ClanLilliston, Ellenore Roscoe Michelle 04/29/2013,8:47 AM

## 2013-04-30 LAB — PROTIME-INR
INR: 2.64 — ABNORMAL HIGH (ref 0.00–1.49)
Prothrombin Time: 27.3 seconds — ABNORMAL HIGH (ref 11.6–15.2)

## 2013-04-30 MED ORDER — WARFARIN SODIUM 5 MG PO TABS
5.0000 mg | ORAL_TABLET | Freq: Once | ORAL | Status: AC
  Administered 2013-04-30: 5 mg via ORAL
  Filled 2013-04-30: qty 1

## 2013-04-30 MED ORDER — IPRATROPIUM-ALBUTEROL 0.5-2.5 (3) MG/3ML IN SOLN
3.0000 mL | RESPIRATORY_TRACT | Status: DC
  Administered 2013-05-01 – 2013-05-02 (×8): 3 mL via RESPIRATORY_TRACT
  Filled 2013-04-30 (×8): qty 3

## 2013-04-30 NOTE — Clinical Social Work Note (Signed)
CSW spoke with MD regarding pt. Possible d/c next 1-2 days. Pt is now off oxygen. [facility] to return when stable.  Derenda FennelKara Kienna Moncada, [place] [phone]

## 2013-04-30 NOTE — Progress Notes (Signed)
Pt is coughing up some blood tinged secretioni fresh

## 2013-04-30 NOTE — Progress Notes (Signed)
[facility] PROGRESS NOTE  Shelia Thompson ZOX:[id number] DOB: [birth year] DOA: 04/26/2013 PCP: Donzetta Sprung, MD  Assessment/Plan: Principal Problem:  Influenza  Active Problems:  Atrial fibrillation  Shortness of breath  UTI (lower urinary tract infection)  Hypokalemia  URI (upper respiratory infection)   80 y.o. female with PMH of HTN, HPL, COPD, a fib on coumadin, spinal DJD, h/o CVA, leg weakness on wheelchair, dementia presented with progressively worsening SOB, productive cough, fever, chills, diarrhea   1. COPD exacerbation/acute bronchitis, +influenza; CXR: no clear infiltrate  -slowly improving; cont supportive care, IV atx, bronchodilators, inhaled steroids; oxygen, tamiflu   2. Diarrhea likely due to viral illness; h/o IBS; exam no s/s of acute abdomen  -improved; c diff neg; cont gentle  IVF, antiemetics, pain control   3. Hypo K likely GI loss; replace recheck in AM   4. A fib RVR on coumadin; echo (2011): LVEF 55%; cont coumadin per pharmacy; resume BB, titrate per response  -repeat echo: LVEF 55%   5. UTI, cont atx  6. Mild neutropenia likely due to viral illness; cont monitoring  None; if consultant consulted, please document name and whether formally or informally consulted   Code Status: full Family Communication: d/w patient, updated her daughter at [phone], [phone] (indicate person spoken with, relationship, and if by phone, the number) Disposition Plan: NH in 2-3 days    Consultants:  None   Procedures:  None   Antibiotics:  Levofloxacin 1/15<<<   (indicate start date, and stop date if known)  HPI/Subjective: alert  Objective: Filed Vitals:   04/30/13 0536  BP: 131/67  Pulse: 79  Temp: 97.5 F (36.4 C)  Resp: 20    Intake/Output Summary (Last 24 hours) at 04/30/13 1115 Last data filed at 04/30/13 0857  Gross per 24 hour  Intake 1702.5 ml  Output      0 ml  Net 1702.5 ml   Filed Weights   04/27/13 1159 04/29/13 0533  04/30/13 0536  Weight: 114.6 kg (252 lb 10.4 oz) 120.5 kg (265 lb 10.5 oz) 121 kg (266 lb 12.1 oz)    Exam:   General:  alert  Cardiovascular: s1,s2 rr  Respiratory: LL rales   Abdomen: soft, nt, nd   Musculoskeletal: no LE edema   Data Reviewed: Basic Metabolic Panel:  Recent Labs Lab 04/26/13 1523 04/27/13 0622 04/28/13 0522  NA 143 144 144  K 3.0* 2.8* 4.0  CL 99 101 103  CO2 30 31 29   GLUCOSE 173* 111* 110*  BUN 12 11 7   CREATININE 0.90 0.88 0.88  CALCIUM 8.8 8.6 8.7   Liver Function Tests: No results found for this basename: AST, ALT, ALKPHOS, BILITOT, PROT, ALBUMIN,  in the last 168 hours No results found for this basename: LIPASE, AMYLASE,  in the last 168 hours No results found for this basename: AMMONIA,  in the last 168 hours CBC:  Recent Labs Lab 04/26/13 1523 04/27/13 0622  WBC 2.6* 2.9*  NEUTROABS 1.7  --   HGB 11.5* 10.4*  HCT 32.7* 29.2*  MCV 86.3 86.4  PLT 125* 109*   Cardiac Enzymes: No results found for this basename: CKTOTAL, CKMB, CKMBINDEX, TROPONINI,  in the last 168 hours BNP (last 3 results) No results found for this basename: PROBNP,  in the last 8760 hours CBG:  Recent Labs Lab 04/28/13 2156  GLUCAP 97    Recent Results (from the past 240 hour(s))  CLOSTRIDIUM DIFFICILE BY PCR     Status: None   Collection  Time    04/26/13  5:18 PM      Result Value Range Status   C difficile by pcr NEGATIVE  NEGATIVE Final     Studies: No results found.  Scheduled Meds: . allopurinol  150 mg Oral Daily  . ALPRAZolam  0.25 mg Oral QHS  . diltiazem  300 mg Oral Daily  . donepezil  10 mg Oral QHS  . gabapentin  100 mg Oral QHS  . ipratropium-albuterol  3 mL Nebulization Q4H  . levofloxacin  750 mg Oral Daily  . loratadine  10 mg Oral Daily  . metoprolol succinate  50 mg Oral BID  . pantoprazole  40 mg Oral BID  . sertraline  100 mg Oral Daily  . sodium chloride  3 mL Intravenous Q12H  . warfarin  5 mg Oral ONCE-1800  .  Warfarin - Pharmacist Dosing Inpatient   Does not apply q1800   Continuous Infusions: . 0.9 % NaCl with KCl 20 mEq / L 50 mL/hr at 04/29/13 2302    Principal Problem:   Influenza Active Problems:   Atrial fibrillation   Shortness of breath   UTI (lower urinary tract infection)   Hypokalemia   URI (upper respiratory infection)   COPD (chronic obstructive pulmonary disease)    Time spent:>35 minutes     Esperanza SheetsBURIEV, Yoltzin Barg N  [facility] Pager [phone]. If 7PM-7AM, please contact night-coverage at [email or web address], password [email or web address] 04/30/2013, 11:15 AM  LOS: 4 days

## 2013-04-30 NOTE — Progress Notes (Signed)
ANTICOAGULATION CONSULT NOTE  Pharmacy Consult for Warfarin Indication: atrial fibrillation  Allergies  Allergen Reactions  . Prednisone     REACTION: unspecified  . Sulfamethoxazole     REACTION: unspecified    Patient Measurements: Height: 5\' 7"  (170.2 cm) Weight: 266 lb 12.1 oz (121 kg) IBW/kg (Calculated) : 61.6  Vital Signs: Temp: 97.5 F (36.4 C) (01/19 0536) Temp src: Oral (01/19 0536) BP: 131/67 mmHg (01/19 0536) Pulse Rate: 79 (01/19 0536)  Labs:  Recent Labs  04/28/13 0522 04/29/13 0546 04/30/13 0514  LABPROT 31.3* 28.8* 27.3*  INR 3.16* 2.83* 2.64*  CREATININE 0.88  --   --     Estimated Creatinine Clearance: 66.4 ml/min (by C-G formula based on Cr of 0.88).   Medical History: Past Medical History  Diagnosis Date  . Unspecified essential hypertension   . Overweight   . Heart failure, diastolic, chronic   . Atrial fibrillation   . COPD (chronic obstructive pulmonary disease)   . Chronic renal insufficiency   . Stroke     hx    Medications:  Prescriptions prior to admission  Medication Sig Dispense Refill  . allopurinol (ZYLOPRIM) 300 MG tablet Take 150 mg by mouth daily.      Marland Kitchen. ALPRAZolam (XANAX) 0.25 MG tablet Take 0.25 mg by mouth at bedtime.       Marland Kitchen. diltiazem (CARDIZEM CD) 300 MG 24 hr capsule Take 300 mg by mouth daily.      Marland Kitchen. donepezil (ARICEPT) 10 MG tablet Take 10 mg by mouth at bedtime.      . gabapentin (NEURONTIN) 100 MG capsule Take 100 mg by mouth at bedtime.      Marland Kitchen. loratadine (CLARITIN) 10 MG tablet Take 10 mg by mouth daily.      . metoprolol succinate (TOPROL-XL) 50 MG 24 hr tablet Take 50 mg by mouth 2 (two) times daily. Take with or immediately following a meal.      . oseltamivir (TAMIFLU) 75 MG capsule Take 75 mg by mouth 2 (two) times daily. For 5 days(started on 04/25/13)      . pantoprazole (PROTONIX) 40 MG tablet Take 40 mg by mouth 2 (two) times daily.      . predniSONE (DELTASONE) 5 MG tablet Take 5 mg by mouth daily.       . sertraline (ZOLOFT) 100 MG tablet Take 100 mg by mouth daily.      Marland Kitchen. warfarin (COUMADIN) 5 MG tablet Take 5 mg by mouth daily.          Assessment: 80 yo F admitted from NH on Coumadin 5mg  po daily for Afib.  INR was elevated on admission, but is now within goal range.  Patient remains on Levaquin.   No bleeding noted.   Goal of Therapy:  INR 2-3   Plan:  Coumadin 5mg  po x1 today INR daily  Keandrea Tapley, Mercy RidingAndrea Michelle 04/30/2013,10:40 AM

## 2013-05-01 LAB — PROTIME-INR
INR: 2.52 — ABNORMAL HIGH (ref 0.00–1.49)
Prothrombin Time: 26.3 seconds — ABNORMAL HIGH (ref 11.6–15.2)

## 2013-05-01 MED ORDER — WARFARIN SODIUM 5 MG PO TABS
5.0000 mg | ORAL_TABLET | Freq: Once | ORAL | Status: AC
  Administered 2013-05-01: 5 mg via ORAL
  Filled 2013-05-01: qty 1

## 2013-05-01 MED ORDER — ALPRAZOLAM 0.5 MG PO TABS
0.5000 mg | ORAL_TABLET | Freq: Three times a day (TID) | ORAL | Status: DC | PRN
  Administered 2013-05-01 – 2013-05-04 (×5): 0.5 mg via ORAL
  Filled 2013-05-01 (×5): qty 1

## 2013-05-01 NOTE — Progress Notes (Addendum)
[facility] PROGRESS NOTE  Shelia PhiKatherine Thompson ZOX:[id number] DOB: [birth year] DOA: 04/26/2013 PCP: Donzetta SprungANIEL, TERRY, MD  Assessment/Plan: Principal Problem:  Influenza  Active Problems:  Atrial fibrillation  Shortness of breath  UTI (lower urinary tract infection)  Hypokalemia  URI (upper respiratory infection)   80 y.o. female with PMH of HTN, HPL, COPD, a fib on coumadin, spinal DJD, h/o CVA, leg weakness on wheelchair, dementia presented with progressively worsening SOB, productive cough, fever, chills, diarrhea   1. COPD exacerbation/acute bronchitis, +influenza; CXR: no clear infiltrate  -slowly improving; cont supportive care, atx, bronchodilators, inhaled steroids; oxygen, tamiflu   2. Diarrhea likely due to viral illness; h/o IBS; exam no s/s of acute abdomen  -improved; c diff neg; cont gentle  IVF, antiemetics, pain control   3. Hypo K likely GI loss; replaced recheck in AM;   4. A fib RVR on coumadin; echo (2011): LVEF 55%; cont coumadin per pharmacy; resume BB, titrate per response  -repeat echo: LVEF 55%   5. UTI, cont atx;   6. Mild neutropenia likely due to viral illness; cont monitoring  None; if consultant consulted, please document name and whether formally or informally consulted  7. Anxiety increased xanax prn  D/c plan NH in 1-2 days   Code Status: full Family Communication: d/w patient, updated her daughter at [phone], [phone] (indicate person spoken with, relationship, and if by phone, the number) Disposition Plan: NH in 2-3 days    Consultants:  None   Procedures:  None   Antibiotics:  Levofloxacin 1/15<<<   (indicate start date, and stop date if known)  HPI/Subjective: alert  Objective: Filed Vitals:   05/01/13 0621  BP: 150/69  Pulse: 89  Temp: 97.8 F (36.6 C)  Resp: 20    Intake/Output Summary (Last 24 hours) at 05/01/13 1101 Last data filed at 04/30/13 1843  Gross per 24 hour  Intake    360 ml  Output      0 ml   Net    360 ml   Filed Weights   04/27/13 1159 04/29/13 0533 04/30/13 0536  Weight: 114.6 kg (252 lb 10.4 oz) 120.5 kg (265 lb 10.5 oz) 121 kg (266 lb 12.1 oz)    Exam:   General:  alert  Cardiovascular: s1,s2 rr  Respiratory: LL rales   Abdomen: soft, nt, nd   Musculoskeletal: no LE edema   Data Reviewed: Basic Metabolic Panel:  Recent Labs Lab 04/26/13 1523 04/27/13 0622 04/28/13 0522  NA 143 144 144  K 3.0* 2.8* 4.0  CL 99 101 103  CO2 30 31 29   GLUCOSE 173* 111* 110*  BUN 12 11 7   CREATININE 0.90 0.88 0.88  CALCIUM 8.8 8.6 8.7   Liver Function Tests: No results found for this basename: AST, ALT, ALKPHOS, BILITOT, PROT, ALBUMIN,  in the last 168 hours No results found for this basename: LIPASE, AMYLASE,  in the last 168 hours No results found for this basename: AMMONIA,  in the last 168 hours CBC:  Recent Labs Lab 04/26/13 1523 04/27/13 0622  WBC 2.6* 2.9*  NEUTROABS 1.7  --   HGB 11.5* 10.4*  HCT 32.7* 29.2*  MCV 86.3 86.4  PLT 125* 109*   Cardiac Enzymes: No results found for this basename: CKTOTAL, CKMB, CKMBINDEX, TROPONINI,  in the last 168 hours BNP (last 3 results) No results found for this basename: PROBNP,  in the last 8760 hours CBG:  Recent Labs Lab 04/28/13 2156  GLUCAP 97    Recent Results (  from the past 240 hour(s))  CLOSTRIDIUM DIFFICILE BY PCR     Status: None   Collection Time    04/26/13  5:18 PM      Result Value Range Status   C difficile by pcr NEGATIVE  NEGATIVE Final     Studies: No results found.  Scheduled Meds: . allopurinol  150 mg Oral Daily  . ALPRAZolam  0.25 mg Oral QHS  . diltiazem  300 mg Oral Daily  . donepezil  10 mg Oral QHS  . gabapentin  100 mg Oral QHS  . ipratropium-albuterol  3 mL Nebulization Q4H WA  . levofloxacin  750 mg Oral Daily  . loratadine  10 mg Oral Daily  . metoprolol succinate  50 mg Oral BID  . pantoprazole  40 mg Oral BID  . sertraline  100 mg Oral Daily  . sodium  chloride  3 mL Intravenous Q12H  . warfarin  5 mg Oral Once  . Warfarin - Pharmacist Dosing Inpatient   Does not apply q1800   Continuous Infusions:    Principal Problem:   Influenza Active Problems:   Atrial fibrillation   Shortness of breath   UTI (lower urinary tract infection)   Hypokalemia   URI (upper respiratory infection)   COPD (chronic obstructive pulmonary disease)    Time spent:>35 minutes     Esperanza Sheets  [facility] Pager [phone]. If 7PM-7AM, please contact night-coverage at [email or web address], password [email or web address] 05/01/2013, 11:01 AM  LOS: 5 days

## 2013-05-01 NOTE — Progress Notes (Signed)
ANTICOAGULATION CONSULT NOTE  Pharmacy Consult for Warfarin Indication: atrial fibrillation  Allergies  Allergen Reactions  . Prednisone     REACTION: unspecified  . Sulfamethoxazole     REACTION: unspecified   Patient Measurements: Height: 5\' 7"  (170.2 cm) Weight: 266 lb 12.1 oz (121 kg) IBW/kg (Calculated) : 61.6  Vital Signs: Temp: 97.8 F (36.6 C) (01/20 0621) Temp src: Oral (01/20 0621) BP: 150/69 mmHg (01/20 0621) Pulse Rate: 89 (01/20 0621)  Labs:  Recent Labs  04/29/13 0546 04/30/13 0514 05/01/13 0618  LABPROT 28.8* 27.3* 26.3*  INR 2.83* 2.64* 2.52*   Estimated Creatinine Clearance: 66.4 ml/min (by C-G formula based on Cr of 0.88).  Medical History: Past Medical History  Diagnosis Date  . Unspecified essential hypertension   . Overweight   . Heart failure, diastolic, chronic   . Atrial fibrillation   . COPD (chronic obstructive pulmonary disease)   . Chronic renal insufficiency   . Stroke     hx   Medications:  Prescriptions prior to admission  Medication Sig Dispense Refill  . allopurinol (ZYLOPRIM) 300 MG tablet Take 150 mg by mouth daily.      Marland Kitchen. ALPRAZolam (XANAX) 0.25 MG tablet Take 0.25 mg by mouth at bedtime.       Marland Kitchen. diltiazem (CARDIZEM CD) 300 MG 24 hr capsule Take 300 mg by mouth daily.      Marland Kitchen. donepezil (ARICEPT) 10 MG tablet Take 10 mg by mouth at bedtime.      . gabapentin (NEURONTIN) 100 MG capsule Take 100 mg by mouth at bedtime.      Marland Kitchen. loratadine (CLARITIN) 10 MG tablet Take 10 mg by mouth daily.      . metoprolol succinate (TOPROL-XL) 50 MG 24 hr tablet Take 50 mg by mouth 2 (two) times daily. Take with or immediately following a meal.      . oseltamivir (TAMIFLU) 75 MG capsule Take 75 mg by mouth 2 (two) times daily. For 5 days(started on 04/25/13)      . pantoprazole (PROTONIX) 40 MG tablet Take 40 mg by mouth 2 (two) times daily.      . predniSONE (DELTASONE) 5 MG tablet Take 5 mg by mouth daily.      . sertraline (ZOLOFT) 100 MG  tablet Take 100 mg by mouth daily.      Marland Kitchen. warfarin (COUMADIN) 5 MG tablet Take 5 mg by mouth daily.         Assessment: 80 yo F admitted from NH on Coumadin 5mg  po daily for Afib.  INR was elevated on admission, but is now within goal range.  Patient remains on Levaquin which can increase INR.   No bleeding noted.   Goal of Therapy:  INR 2-3   Plan:  Coumadin 5mg  po x1 today INR daily  Jerin Franzel A 05/01/2013,8:40 AM

## 2013-05-02 LAB — BASIC METABOLIC PANEL
BUN: 4 mg/dL — AB (ref 6–23)
CHLORIDE: 102 meq/L (ref 96–112)
CO2: 29 mEq/L (ref 19–32)
Calcium: 8.5 mg/dL (ref 8.4–10.5)
Creatinine, Ser: 0.99 mg/dL (ref 0.50–1.10)
GFR calc Af Amer: 60 mL/min — ABNORMAL LOW (ref >90)
GFR calc non Af Amer: 52 mL/min — ABNORMAL LOW (ref >90)
GLUCOSE: 125 mg/dL — AB (ref 70–99)
POTASSIUM: 3.7 meq/L (ref 3.7–5.3)
SODIUM: 141 meq/L (ref 137–147)

## 2013-05-02 LAB — CBC
HEMATOCRIT: 29.5 % — AB (ref 36.0–46.0)
HEMOGLOBIN: 9.9 g/dL — AB (ref 12.0–15.0)
MCH: 29 pg (ref 26.0–34.0)
MCHC: 33.6 g/dL (ref 30.0–36.0)
MCV: 86.5 fL (ref 78.0–100.0)
Platelets: 167 10*3/uL (ref 150–400)
RBC: 3.41 MIL/uL — ABNORMAL LOW (ref 3.87–5.11)
RDW: 19.7 % — ABNORMAL HIGH (ref 11.5–15.5)
WBC: 3.6 10*3/uL — ABNORMAL LOW (ref 4.0–10.5)

## 2013-05-02 LAB — PROTIME-INR
INR: 2.62 — AB (ref 0.00–1.49)
PROTHROMBIN TIME: 27.1 s — AB (ref 11.6–15.2)

## 2013-05-02 MED ORDER — IPRATROPIUM-ALBUTEROL 0.5-2.5 (3) MG/3ML IN SOLN
3.0000 mL | Freq: Four times a day (QID) | RESPIRATORY_TRACT | Status: DC
  Administered 2013-05-03 – 2013-05-05 (×9): 3 mL via RESPIRATORY_TRACT
  Filled 2013-05-02 (×10): qty 3

## 2013-05-02 MED ORDER — WARFARIN SODIUM 5 MG PO TABS
5.0000 mg | ORAL_TABLET | ORAL | Status: DC
  Administered 2013-05-02 – 2013-05-04 (×3): 5 mg via ORAL
  Filled 2013-05-02 (×3): qty 1

## 2013-05-02 MED ORDER — METHYLPREDNISOLONE SODIUM SUCC 125 MG IJ SOLR
60.0000 mg | Freq: Four times a day (QID) | INTRAMUSCULAR | Status: DC
  Administered 2013-05-02 – 2013-05-03 (×6): 60 mg via INTRAVENOUS
  Filled 2013-05-02 (×7): qty 2

## 2013-05-02 NOTE — Progress Notes (Signed)
ANTICOAGULATION CONSULT NOTE  Pharmacy Consult for Warfarin Indication: atrial fibrillation  Allergies  Allergen Reactions  . Prednisone     REACTION: unspecified  . Sulfamethoxazole     REACTION: unspecified   Patient Measurements: Height: 5\' 7"  (170.2 cm) Weight: 268 lb 15.4 oz (122 kg) IBW/kg (Calculated) : 61.6  Vital Signs: Temp: 98.5 F (36.9 C) (01/21 0700) Temp src: Oral (01/21 0700) BP: 140/75 mmHg (01/21 0700) Pulse Rate: 85 (01/21 0700)  Labs:  Recent Labs  04/30/13 0514 05/01/13 0618 05/02/13 0642  HGB  --   --  9.9*  HCT  --   --  29.5*  PLT  --   --  167  LABPROT 27.3* 26.3* 27.1*  INR 2.64* 2.52* 2.62*  CREATININE  --   --  0.99   Estimated Creatinine Clearance: 59.3 ml/min (by C-G formula based on Cr of 0.99).  Medical History: Past Medical History  Diagnosis Date  . Unspecified essential hypertension   . Overweight   . Heart failure, diastolic, chronic   . Atrial fibrillation   . COPD (chronic obstructive pulmonary disease)   . Chronic renal insufficiency   . Stroke     hx   Medications:  Prescriptions prior to admission  Medication Sig Dispense Refill  . allopurinol (ZYLOPRIM) 300 MG tablet Take 150 mg by mouth daily.      Marland Kitchen. ALPRAZolam (XANAX) 0.25 MG tablet Take 0.25 mg by mouth at bedtime.       Marland Kitchen. diltiazem (CARDIZEM CD) 300 MG 24 hr capsule Take 300 mg by mouth daily.      Marland Kitchen. donepezil (ARICEPT) 10 MG tablet Take 10 mg by mouth at bedtime.      . gabapentin (NEURONTIN) 100 MG capsule Take 100 mg by mouth at bedtime.      Marland Kitchen. loratadine (CLARITIN) 10 MG tablet Take 10 mg by mouth daily.      . metoprolol succinate (TOPROL-XL) 50 MG 24 hr tablet Take 50 mg by mouth 2 (two) times daily. Take with or immediately following a meal.      . oseltamivir (TAMIFLU) 75 MG capsule Take 75 mg by mouth 2 (two) times daily. For 5 days(started on 04/25/13)      . pantoprazole (PROTONIX) 40 MG tablet Take 40 mg by mouth 2 (two) times daily.      .  predniSONE (DELTASONE) 5 MG tablet Take 5 mg by mouth daily.      . sertraline (ZOLOFT) 100 MG tablet Take 100 mg by mouth daily.      Marland Kitchen. warfarin (COUMADIN) 5 MG tablet Take 5 mg by mouth daily.         Assessment: 80 yo F admitted from NH on Coumadin 5mg  po daily for Afib.  INR was elevated on admission, but is now within goal range.  Patient remains on Levaquin which can increase INR.   No bleeding noted.   Goal of Therapy:  INR 2-3   Plan:  Coumadin 5mg  po daily Decrease INR to MWF 7 days of Levaquin will be completed today- consider stop after today's dose if appropriate.   Elson ClanLilliston, Pegah Segel Michelle 05/02/2013,8:36 AM

## 2013-05-02 NOTE — Progress Notes (Signed)
Notified Dr. Sharl MaLama of patient's removal from telemetry.  Patient been stable a.fib all day.  Will monitor patient.

## 2013-05-02 NOTE — Progress Notes (Signed)
[facility] PROGRESS NOTE  Shelia Thompson ZOX:[id number] DOB: December 26, 1929 DOA: 04/26/2013 PCP: Donzetta Sprung, MD  Assessment/Plan: Principal Problem:  Influenza  Active Problems:  Atrial fibrillation  Shortness of breath  UTI (lower urinary tract infection)  Hypokalemia  URI (upper respiratory infection)   80 y.o. female with PMH of HTN, HPL, COPD, a fib on coumadin, spinal DJD, h/o CVA, leg weakness on wheelchair, dementia presented with progressively worsening SOB, productive cough, fever, chills, diarrhea   1. COPD exacerbation/acute bronchitis, +influenza; CXR: no clear infiltrate  Still diffusely wheezing, will need steroids. Will contact pharmacy to initiate IV steroids. Will need to contact the family for information regarding her prednisone allergy, for now cont supportive care, atx, bronchodilators, inhaled steroids; oxygen, tamiflu   2. Diarrhea likely due to viral illness; h/o IBS; exam no s/s of acute abdomen  -improved; c diff neg; cont gentle  IVF, antiemetics, pain control   3. Hypo K likely GI loss; replaced recheck in AM;   4. A fib RVR on coumadin; echo (2011): LVEF 55%; cont coumadin per pharmacy; resume BB, titrate per response  -repeat echo: LVEF 55%   5. UTI, cont atx;   6. Mild neutropenia likely due to viral illness; cont monitoring  None; if consultant consulted, please document name and whether formally or informally consulted  7. Anxiety increased xanax prn  D/c plan NH in 1-2 days   Code Status: full Family Communication: d/w patient, updated her daughter at [phone], [phone]  Disposition Plan: NH in 2-3 days    Consultants:  None   Procedures:  None   Antibiotics:  Levofloxacin 1/15<<<   (indicate start date, and stop date if known)  HPI/Subjective: alert  Objective: Filed Vitals:   05/02/13 0700  BP: 140/75  Pulse: 85  Temp: 98.5 F (36.9 C)  Resp:     Intake/Output Summary (Last 24 hours) at 05/02/13 1236 Last  data filed at 05/02/13 0800  Gross per 24 hour  Intake    420 ml  Output      0 ml  Net    420 ml   Filed Weights   04/29/13 0533 04/30/13 0536 05/02/13 0500  Weight: 120.5 kg (265 lb 10.5 oz) 121 kg (266 lb 12.1 oz) 122 kg (268 lb 15.4 oz)    Exam:   General:  alert  Cardiovascular: s1,s2 rr  Respiratory: diffusely wheezing bilaterally.  Abdomen: soft, nt, nd   Musculoskeletal: no LE edema   Data Reviewed: Basic Metabolic Panel:  Recent Labs Lab 04/26/13 1523 04/27/13 0622 04/28/13 0522 05/02/13 0642  NA 143 144 144 141  K 3.0* 2.8* 4.0 3.7  CL 99 101 103 102  CO2 30 31 29 29   GLUCOSE 173* 111* 110* 125*  BUN 12 11 7  4*  CREATININE 0.90 0.88 0.88 0.99  CALCIUM 8.8 8.6 8.7 8.5   Liver Function Tests: No results found for this basename: AST, ALT, ALKPHOS, BILITOT, PROT, ALBUMIN,  in the last 168 hours No results found for this basename: LIPASE, AMYLASE,  in the last 168 hours No results found for this basename: AMMONIA,  in the last 168 hours CBC:  Recent Labs Lab 04/26/13 1523 04/27/13 0622 05/02/13 0642  WBC 2.6* 2.9* 3.6*  NEUTROABS 1.7  --   --   HGB 11.5* 10.4* 9.9*  HCT 32.7* 29.2* 29.5*  MCV 86.3 86.4 86.5  PLT 125* 109* 167   Cardiac Enzymes: No results found for this basename: CKTOTAL, CKMB, CKMBINDEX, TROPONINI,  in the  last 168 hours BNP (last 3 results) No results found for this basename: PROBNP,  in the last 8760 hours CBG:  Recent Labs Lab 04/28/13 2156  GLUCAP 97    Recent Results (from the past 240 hour(s))  CLOSTRIDIUM DIFFICILE BY PCR     Status: None   Collection Time    04/26/13  5:18 PM      Result Value Range Status   C difficile by pcr NEGATIVE  NEGATIVE Final     Studies: No results found.  Scheduled Meds: . allopurinol  150 mg Oral Daily  . diltiazem  300 mg Oral Daily  . donepezil  10 mg Oral QHS  . gabapentin  100 mg Oral QHS  . ipratropium-albuterol  3 mL Nebulization Q4H WA  . levofloxacin  750 mg  Oral Daily  . loratadine  10 mg Oral Daily  . metoprolol succinate  50 mg Oral BID  . pantoprazole  40 mg Oral BID  . sertraline  100 mg Oral Daily  . sodium chloride  3 mL Intravenous Q12H  . warfarin  5 mg Oral Q24H  . Warfarin - Pharmacist Dosing Inpatient   Does not apply q1800   Continuous Infusions:    Principal Problem:   Influenza Active Problems:   Atrial fibrillation   Shortness of breath   UTI (lower urinary tract infection)   Hypokalemia   URI (upper respiratory infection)   COPD (chronic obstructive pulmonary disease)    Time spent:>35 minutes     Laird Runnion  [facility] Pager [phone] If 7PM-7AM, please contact night-coverage at [email or web address], password [email or web address] 05/02/2013, 12:36 PM  LOS: 6 days

## 2013-05-03 NOTE — Progress Notes (Signed)
Paged mid level MD regarding patient having no iv access, he placed order for a PICC when i told him she was to receive solumedrol at 0115 and 0715. I then notified suggested and notified him I looked up in the drug guide book that I could administer solumedrol IM however he told me it does not give him that option in the orders and for me to hold the solumedrol tonight and chart no IV access. Helmut MusterAlicia RN

## 2013-05-03 NOTE — Progress Notes (Signed)
Nurse Supervisor Purcell NailsAnn Claire attemtped to get an IV site and did not succeed.  I will page hospitalist and ask if we could keep no iv site do to patient being stuff so many times with no success and no iv fluids are ordered continuous and patient is drinking fluids PO regularly. Helmut MusterAlicia RN

## 2013-05-03 NOTE — Progress Notes (Signed)
I received report that patient did not have an IV site. When I went into the patients room to introduce myself and assess the patient she told me her arm hurt very bad where she had been stuck and there is a hematoma the size of a gold ball to the left posterior forearm. I told her we would still need an IV site and I would try a little later but will give her an ice pack to place on her arm right now. Bennett ScrapeAlicia Wright RN

## 2013-05-03 NOTE — Progress Notes (Signed)
[facility] PROGRESS NOTE  Shelia Thompson WUJ:[id number] DOB: 10/28/1929 DOA: 04/26/2013 PCP: Donzetta SprungANIEL, TERRY, MD  Assessment/Plan: Principal Problem:  Influenza  Active Problems:  Atrial fibrillation  Shortness of breath  UTI (lower urinary tract infection)  Hypokalemia  URI (upper respiratory infection)   80 y.o. female with PMH of HTN, HPL, COPD, a fib on coumadin, spinal DJD, h/o CVA, leg weakness on wheelchair, dementia presented with progressively worsening SOB, productive cough, fever, chills, diarrhea   1. COPD exacerbation/acute bronchitis, +influenza; CXR: no clear infiltrate  Wheezing improving, breathing better. Started her on IV steroids yesterday.  cont supportive care, atx, bronchodilators, inhaled steroids; oxygen, . Completed 7 days of levaquin.   2. Diarrhea likely due to viral illness; h/o IBS; exam no s/s of acute abdomen  -improved; c diff neg; cont gentle  IVF, antiemetics, pain control   3. Hypo K likely GI loss; replaced recheck in AM;   4. A fib RVR on coumadin; echo (2011): LVEF 55%; cont coumadin per pharmacy; resume BB, titrate per response  -repeat echo: LVEF 55% . INR therapeutic.   5.  UTI: completed 7 days of levaquin.   6. Mild neutropenia likely due to viral illness; cont monitoring    7. Anxiety increased xanax prn  D/c plan NH in 1-2 days   Code Status: full Family Communication: d/w patient, updated her daughter at [phone], [phone]  Disposition Plan: NH in 2-3 days    Consultants:  None   Procedures:  None   Antibiotics:  Levofloxacin 1/15<<< 1/21   (indicate start date, and stop date if known)  HPI/Subjective: Alert, comfortable. Wants to know when she can go home.   Objective: Filed Vitals:   05/03/13 0553  BP: 123/59  Pulse: 72  Temp: 97.4 F (36.3 C)  Resp: 18    Intake/Output Summary (Last 24 hours) at 05/03/13 0949 Last data filed at 05/03/13 [id number]  Gross per 24 hour  Intake    480 ml  Output       2 ml  Net    478 ml   Filed Weights   04/30/13 0536 05/02/13 0500 05/03/13 0553  Weight: 121 kg (266 lb 12.1 oz) 122 kg (268 lb 15.4 oz) 119.2 kg (262 lb 12.6 oz)    Exam:   General:  alert  Cardiovascular: s1,s2 rr  Respiratory: wheezing has improved ,   Abdomen: soft, nt, nd   Musculoskeletal: trace LE edema, present.   Data Reviewed: Basic Metabolic Panel:  Recent Labs Lab 04/26/13 1523 04/27/13 0622 04/28/13 0522 05/02/13 0642  NA 143 144 144 141  K 3.0* 2.8* 4.0 3.7  CL 99 101 103 102  CO2 30 31 29 29   GLUCOSE 173* 111* 110* 125*  BUN 12 11 7  4*  CREATININE 0.90 0.88 0.88 0.99  CALCIUM 8.8 8.6 8.7 8.5   Liver Function Tests: No results found for this basename: AST, ALT, ALKPHOS, BILITOT, PROT, ALBUMIN,  in the last 168 hours No results found for this basename: LIPASE, AMYLASE,  in the last 168 hours No results found for this basename: AMMONIA,  in the last 168 hours CBC:  Recent Labs Lab 04/26/13 1523 04/27/13 0622 05/02/13 0642  WBC 2.6* 2.9* 3.6*  NEUTROABS 1.7  --   --   HGB 11.5* 10.4* 9.9*  HCT 32.7* 29.2* 29.5*  MCV 86.3 86.4 86.5  PLT 125* 109* 167   Cardiac Enzymes: No results found for this basename: CKTOTAL, CKMB, CKMBINDEX, TROPONINI,  in the last 168  hours BNP (last 3 results) No results found for this basename: PROBNP,  in the last 8760 hours CBG:  Recent Labs Lab 04/28/13 2156  GLUCAP 97    Recent Results (from the past 240 hour(s))  CLOSTRIDIUM DIFFICILE BY PCR     Status: None   Collection Time    04/26/13  5:18 PM      Result Value Range Status   C difficile by pcr NEGATIVE  NEGATIVE Final     Studies: No results found.  Scheduled Meds: . allopurinol  150 mg Oral Daily  . diltiazem  300 mg Oral Daily  . donepezil  10 mg Oral QHS  . gabapentin  100 mg Oral QHS  . ipratropium-albuterol  3 mL Nebulization QID  . loratadine  10 mg Oral Daily  . methylPREDNISolone (SOLU-MEDROL) injection  60 mg Intravenous Q6H  .  metoprolol succinate  50 mg Oral BID  . pantoprazole  40 mg Oral BID  . sertraline  100 mg Oral Daily  . sodium chloride  3 mL Intravenous Q12H  . warfarin  5 mg Oral Q24H  . Warfarin - Pharmacist Dosing Inpatient   Does not apply q1800   Continuous Infusions:    Principal Problem:   Influenza Active Problems:   Atrial fibrillation   Shortness of breath   UTI (lower urinary tract infection)   Hypokalemia   URI (upper respiratory infection)   COPD (chronic obstructive pulmonary disease)    Time spent:>30 minutes     Shelia Thompson  [facility] Pager [phone] If 7PM-7AM, please contact night-coverage at [email or web address], password [facility] 05/03/2013, 9:49 AM  LOS: 7 days

## 2013-05-03 NOTE — Progress Notes (Signed)
PT Cancellation Note  Patient Details Name: Rose PhiKatherine Deriso MRN: [id number] DOB: January 23, 1930   Cancelled Treatment:    Reason Eval/Treat Not Completed: PT screened, no needs identified, will sign off Pt is normally bed bound, occasionally staff at NH transfers her to a w/c with a sliding board.  There should be no need for a PT evaluation.  Myrlene BrokerBrown, Willona Phariss L 05/03/2013, 1:01 PM

## 2013-05-03 NOTE — Progress Notes (Signed)
Placed IV starter kit in patients room, told her I would try to start a new IV on her once I have finished with my 10pm meds. Elmo Putt RN [phone]

## 2013-05-04 LAB — PROTIME-INR
INR: 2.27 — AB (ref 0.00–1.49)
Prothrombin Time: 24.3 seconds — ABNORMAL HIGH (ref 11.6–15.2)

## 2013-05-04 MED ORDER — PREDNISONE 20 MG PO TABS
60.0000 mg | ORAL_TABLET | Freq: Every day | ORAL | Status: DC
  Administered 2013-05-04 – 2013-05-05 (×2): 60 mg via ORAL
  Filled 2013-05-04 (×2): qty 3

## 2013-05-04 MED ORDER — METHYLPREDNISOLONE SODIUM SUCC 125 MG IJ SOLR: 60.0000 mg | Freq: Two times a day (BID) | INTRAMUSCULAR | Status: DC

## 2013-05-04 NOTE — Progress Notes (Signed)
[facility] PROGRESS NOTE  Rose PhiKatherine Elmes WUJ:[id number] DOB: 02-May-1929 DOA: 04/26/2013 PCP: Donzetta SprungANIEL, TERRY, MD  Assessment/Plan: Principal Problem:  Influenza  Active Problems:  Atrial fibrillation  Shortness of breath  UTI (lower urinary tract infection)  Hypokalemia  URI (upper respiratory infection)   80 y.o. female with PMH of HTN, HPL, COPD, a fib on coumadin, spinal DJD, h/o CVA, leg weakness on wheelchair, dementia presented with progressively worsening SOB, productive cough, fever, chills, diarrhea   1. COPD exacerbation/acute bronchitis, +influenza; CXR: no clear infiltrate  Wheezing improving, breathing better. Started her on IV steroids 2 days ago, later on transitioned to po prednisone. We will do a quick taper in the next one week.   cont supportive care, atx, bronchodilators, inhaled steroids; oxygen, . Completed 7 days of levaquin.   2. Diarrhea likely due to viral illness; h/o IBS; exam no s/s of acute abdomen  -improved; c diff neg; cont gentle  IVF, antiemetics, pain control   3. Hypo K likely GI loss; replaced recheck in AM;   4. A fib RVR on coumadin; echo (2011): LVEF 55%; cont coumadin per pharmacy; resume BB, titrate per response  -repeat echo: LVEF 55% . INR therapeutic.   5.  UTI: completed 7 days of levaquin.   6. Mild neutropenia likely due to viral illness; cont monitoring    7. Anxiety increased xanax prn  D/c plan NH tomorrow.   Code Status: full Family Communication: d/w patient, updated her daughter at [phone], [phone]  Disposition Plan: tomorrow.    Consultants:  None   Procedures:  None   Antibiotics:  Levofloxacin 1/15<<< 1/21   (indicate start date, and stop date if known)  HPI/Subjective: Alert, comfortable. She does not want to go home today. Feels better.   Objective: Filed Vitals:   05/04/13 0500  BP: 134/47  Pulse: 76  Temp: 97.6 F (36.4 C)  Resp: 20    Intake/Output Summary (Last 24 hours) at  05/04/13 1127 Last data filed at 05/03/13 2000  Gross per 24 hour  Intake    716 ml  Output      0 ml  Net    716 ml   Filed Weights   05/02/13 0500 05/03/13 0553 05/04/13 0500  Weight: 122 kg (268 lb 15.4 oz) 119.2 kg (262 lb 12.6 oz) 121.8 kg (268 lb 8.3 oz)    Exam:   General:  alert  Cardiovascular: s1,s2 rr  Respiratory: wheezing has improved , good air entry bilateral.   Abdomen: soft, nt, nd   Musculoskeletal: trace LE edema, present.   Data Reviewed: Basic Metabolic Panel:  Recent Labs Lab 04/28/13 0522 05/02/13 0642  NA 144 141  K 4.0 3.7  CL 103 102  CO2 29 29  GLUCOSE 110* 125*  BUN 7 4*  CREATININE 0.88 0.99  CALCIUM 8.7 8.5   Liver Function Tests: No results found for this basename: AST, ALT, ALKPHOS, BILITOT, PROT, ALBUMIN,  in the last 168 hours No results found for this basename: LIPASE, AMYLASE,  in the last 168 hours No results found for this basename: AMMONIA,  in the last 168 hours CBC:  Recent Labs Lab 05/02/13 0642  WBC 3.6*  HGB 9.9*  HCT 29.5*  MCV 86.5  PLT 167   Cardiac Enzymes: No results found for this basename: CKTOTAL, CKMB, CKMBINDEX, TROPONINI,  in the last 168 hours BNP (last 3 results) No results found for this basename: PROBNP,  in the last 8760 hours CBG:  Recent Labs  Lab 04/28/13 2156  GLUCAP 97    Recent Results (from the past 240 hour(s))  CLOSTRIDIUM DIFFICILE BY PCR     Status: None   Collection Time    04/26/13  5:18 PM      Result Value Range Status   C difficile by pcr NEGATIVE  NEGATIVE Final     Studies: No results found.  Scheduled Meds: . allopurinol  150 mg Oral Daily  . diltiazem  300 mg Oral Daily  . donepezil  10 mg Oral QHS  . gabapentin  100 mg Oral QHS  . ipratropium-albuterol  3 mL Nebulization QID  . loratadine  10 mg Oral Daily  . metoprolol succinate  50 mg Oral BID  . pantoprazole  40 mg Oral BID  . predniSONE  60 mg Oral Q breakfast  . sertraline  100 mg Oral Daily  .  sodium chloride  3 mL Intravenous Q12H  . warfarin  5 mg Oral Q24H  . Warfarin - Pharmacist Dosing Inpatient   Does not apply q1800   Continuous Infusions:    Principal Problem:   Influenza Active Problems:   Atrial fibrillation   Shortness of breath   UTI (lower urinary tract infection)   Hypokalemia   URI (upper respiratory infection)   COPD (chronic obstructive pulmonary disease)    Time spent:>30 minutes     Ronesha Heenan  [facility] Pager [phone] If 7PM-7AM, please contact night-coverage at [email or web address], password [facility] 05/04/2013, 11:27 AM  LOS: 8 days

## 2013-05-04 NOTE — Clinical Social Work Note (Signed)
CSW updated facility on pt. Agreeable to possible d/c tomorrow.   Derenda FennelKara Abdulahi Schor, Kentucky[place]CS[place] [phone]

## 2013-05-04 NOTE — Progress Notes (Signed)
ANTICOAGULATION CONSULT NOTE  Pharmacy Consult for Warfarin Indication: atrial fibrillation  Allergies  Allergen Reactions  . Morphine And Related   . Sulfamethoxazole     REACTION: unspecified   Patient Measurements: Height: 5\' 7"  (170.2 cm) Weight: 268 lb 8.3 oz (121.8 kg) IBW/kg (Calculated) : 61.6  Vital Signs: Temp: 97.6 F (36.4 C) (01/23 0500) Temp src: Oral (01/23 0500) BP: 134/47 mmHg (01/23 0500) Pulse Rate: 76 (01/23 0500)  Labs:  Recent Labs  05/02/13 0642 05/04/13 0658  HGB 9.9*  --   HCT 29.5*  --   PLT 167  --   LABPROT 27.1* 24.3*  INR 2.62* 2.27*  CREATININE 0.99  --    Estimated Creatinine Clearance: 59.3 ml/min (by C-G formula based on Cr of 0.99).  Medical History: Past Medical History  Diagnosis Date  . Unspecified essential hypertension   . Overweight   . Heart failure, diastolic, chronic   . Atrial fibrillation   . COPD (chronic obstructive pulmonary disease)   . Chronic renal insufficiency   . Stroke     hx   Medications:  Prescriptions prior to admission  Medication Sig Dispense Refill  . allopurinol (ZYLOPRIM) 300 MG tablet Take 150 mg by mouth daily.      Marland Kitchen. ALPRAZolam (XANAX) 0.25 MG tablet Take 0.25 mg by mouth at bedtime.       Marland Kitchen. diltiazem (CARDIZEM CD) 300 MG 24 hr capsule Take 300 mg by mouth daily.      Marland Kitchen. donepezil (ARICEPT) 10 MG tablet Take 10 mg by mouth at bedtime.      . gabapentin (NEURONTIN) 100 MG capsule Take 100 mg by mouth at bedtime.      Marland Kitchen. loratadine (CLARITIN) 10 MG tablet Take 10 mg by mouth daily.      . metoprolol succinate (TOPROL-XL) 50 MG 24 hr tablet Take 50 mg by mouth 2 (two) times daily. Take with or immediately following a meal.      . oseltamivir (TAMIFLU) 75 MG capsule Take 75 mg by mouth 2 (two) times daily. For 5 days(started on 04/25/13)      . pantoprazole (PROTONIX) 40 MG tablet Take 40 mg by mouth 2 (two) times daily.      . predniSONE (DELTASONE) 5 MG tablet Take 5 mg by mouth daily.      .  sertraline (ZOLOFT) 100 MG tablet Take 100 mg by mouth daily.      Marland Kitchen. warfarin (COUMADIN) 5 MG tablet Take 5 mg by mouth daily.         Assessment: 80 yo F admitted from NH on Coumadin 5mg  po daily for Afib.  INR is therapeutic and appears stable.    No bleeding noted.   Goal of Therapy:  INR 2-3   Plan:  Coumadin 5mg  po daily Check INR MWF  Margo AyeHall, Phelix Fudala A 05/04/2013,8:41 AM

## 2013-05-05 MED ORDER — PREDNISONE 20 MG PO TABS: ORAL_TABLET | ORAL | Status: AC

## 2013-05-05 MED ORDER — GUAIFENESIN-DM 100-10 MG/5ML PO SYRP: 5.0000 mL | ORAL_SOLUTION | ORAL | Status: AC | PRN

## 2013-05-05 MED ORDER — ALPRAZOLAM 0.25 MG PO TABS: 0.2500 mg | ORAL_TABLET | Freq: Two times a day (BID) | ORAL | Status: AC | PRN

## 2013-05-05 MED ORDER — IPRATROPIUM-ALBUTEROL 0.5-2.5 (3) MG/3ML IN SOLN: 3.0000 mL | RESPIRATORY_TRACT | Status: AC | PRN

## 2013-05-05 NOTE — Progress Notes (Signed)
[facility] [place] [facility]. Pt is in NAD, IV is out, all paperwork has been reviewed/discussed with patient, and there are no questions/concerns at this time. Report has been called to receiving facility. Assessment is unchanged from this morning. Pt is to be accompanied downstairs by EMS.

## 2013-05-05 NOTE — Progress Notes (Signed)
ANTICOAGULATION CONSULT NOTE  Pharmacy Consult for Warfarin Indication: atrial fibrillation  Allergies  Allergen Reactions  . Morphine And Related   . Sulfamethoxazole     REACTION: unspecified   Patient Measurements: Height: 5\' 7"  (170.2 cm) Weight: 275 lb 2.2 oz (124.8 kg) IBW/kg (Calculated) : 61.6  Vital Signs: Temp: 97.4 F (36.3 C) (01/24 0417) Temp src: Oral (01/24 0417) BP: 131/67 mmHg (01/24 0417) Pulse Rate: 77 (01/24 0417)  Labs:  Recent Labs  05/04/13 0658  LABPROT 24.3*  INR 2.27*   Estimated Creatinine Clearance: 60.1 ml/min (by C-G formula based on Cr of 0.99).  Medical History: Past Medical History  Diagnosis Date  . Unspecified essential hypertension   . Overweight   . Heart failure, diastolic, chronic   . Atrial fibrillation   . COPD (chronic obstructive pulmonary disease)   . Chronic renal insufficiency   . Stroke     hx   Medications:  Prescriptions prior to admission  Medication Sig Dispense Refill  . allopurinol (ZYLOPRIM) 300 MG tablet Take 150 mg by mouth daily.      Marland Kitchen. ALPRAZolam (XANAX) 0.25 MG tablet Take 0.25 mg by mouth at bedtime.       Marland Kitchen. diltiazem (CARDIZEM CD) 300 MG 24 hr capsule Take 300 mg by mouth daily.      Marland Kitchen. donepezil (ARICEPT) 10 MG tablet Take 10 mg by mouth at bedtime.      . gabapentin (NEURONTIN) 100 MG capsule Take 100 mg by mouth at bedtime.      Marland Kitchen. loratadine (CLARITIN) 10 MG tablet Take 10 mg by mouth daily.      . metoprolol succinate (TOPROL-XL) 50 MG 24 hr tablet Take 50 mg by mouth 2 (two) times daily. Take with or immediately following a meal.      . oseltamivir (TAMIFLU) 75 MG capsule Take 75 mg by mouth 2 (two) times daily. For 5 days(started on 04/25/13)      . pantoprazole (PROTONIX) 40 MG tablet Take 40 mg by mouth 2 (two) times daily.      . predniSONE (DELTASONE) 5 MG tablet Take 5 mg by mouth daily.      . sertraline (ZOLOFT) 100 MG tablet Take 100 mg by mouth daily.      Marland Kitchen. warfarin (COUMADIN) 5 MG  tablet Take 5 mg by mouth daily.         Assessment: 80 yo F admitted from NH on Coumadin 5mg  po daily for Afib.  INR is therapeutic and appears stable.    No bleeding noted.   Goal of Therapy:  INR 2-3   Plan:  Coumadin 5mg  po daily Check INR MWF  Raquel Jamesittman, Katura Eatherly Bennett 05/05/2013,8:06 AM

## 2013-05-05 NOTE — Discharge Summary (Addendum)
Physician Discharge Summary  Shelia Thompson BMW:[id number] DOB: [birth year] DOA: 04/26/2013  PCP: Donzetta Sprung, MD  Admit date: 04/26/2013 Discharge date: 05/05/2013  Time spent: 30 minutes  Recommendations for Outpatient Follow-up:  1. Follow up with PCP in one week.   Discharge Diagnoses:  Principal Problem:   Influenza Active Problems:   Atrial fibrillation   Shortness of breath   UTI (lower urinary tract infection)   Hypokalemia   URI (upper respiratory infection)   COPD (chronic obstructive pulmonary disease)   Discharge Condition: improved.   Diet recommendation: low sodium diet  Filed Weights   05/03/13 0553 05/04/13 0500 05/05/13 0417  Weight: 119.2 kg (262 lb 12.6 oz) 121.8 kg (268 lb 8.3 oz) 124.8 kg (275 lb 2.2 oz)    History of present illness:   Shelia Thompson is a 80 y.o. female with PMH of HTN, HPL, COPD, a fib on coumadin, spinal DJD, h/o CVA, leg weakness on wheelchair presented with progressively worsening SOB, productive cough, fever, chills, post recent Dx influenza; she was started on tamiflu btu symptoms got worse with nausea, diarrhea, fever, cough;   Hospital Course:  1. COPD exacerbation/acute bronchitis, +influenza; CXR: no clear infiltrate  Wheezing improving, breathing better. Started her on IV steroids 2 days ago, later on transitioned to po prednisone. We will do a quick taper in the next one week. cont supportive care, atx, bronchodilators, inhaled steroids; oxygen, . Completed 7 days of levaquin.  2. Diarrhea likely due to viral illness; h/o IBS; exam no s/s of acute abdomen  -improved; c diff neg; resolved.  3. Hypo K likely GI loss; replaced . 4. A fib RVR on coumadin; echo (2011): LVEF 55%; cont coumadin per pharmacy; resume BB, titrate per response  -repeat echo: LVEF 55% . INR therapeutic.  5. UTI: completed 7 days of levaquin.  6. Mild neutropenia likely due to viral illness; follow up with cbc in one week.  7. Anxiety  xanax  prn   Procedures:  none  Consultations:  none  Discharge Exam: Filed Vitals:   05/05/13 0417  BP: 131/67  Pulse: 77  Temp: 97.4 F (36.3 C)  Resp: 20    General: alert Cardiovascular: s1,s2 rr Respiratory: wheezing has improved , good air entry bilateral. Abdomen: soft, nt, nd Musculoskeletal: trace LE edema, present.    Discharge Instructions      Discharge Orders   Future Orders Complete By Expires   Diet - low sodium heart healthy  As directed    Discharge instructions  As directed    Comments:     Follow up with PCP in one week       Medication List    STOP taking these medications       oseltamivir 75 MG capsule  Commonly known as:  TAMIFLU      TAKE these medications       allopurinol 300 MG tablet  Commonly known as:  ZYLOPRIM  Take 150 mg by mouth daily.     ALPRAZolam 0.25 MG tablet  Commonly known as:  XANAX  Take 1 tablet (0.25 mg total) by mouth 2 (two) times daily as needed for anxiety.     diltiazem 300 MG 24 hr capsule  Commonly known as:  CARDIZEM CD  Take 300 mg by mouth daily.     donepezil 10 MG tablet  Commonly known as:  ARICEPT  Take 10 mg by mouth at bedtime.     gabapentin 100 MG capsule  Commonly known as:  NEURONTIN  Take 100 mg by mouth at bedtime.     guaiFENesin-dextromethorphan 100-10 MG/5ML syrup  Commonly known as:  ROBITUSSIN DM  Take 5 mLs by mouth every 4 (four) hours as needed for cough.     ipratropium-albuterol 0.5-2.5 (3) MG/3ML Soln  Commonly known as:  DUONEB  Take 3 mLs by nebulization every 4 (four) hours as needed.     loratadine 10 MG tablet  Commonly known as:  CLARITIN  Take 10 mg by mouth daily.     metoprolol succinate 50 MG 24 hr tablet  Commonly known as:  TOPROL-XL  Take 50 mg by mouth 2 (two) times daily. Take with or immediately following a meal.     pantoprazole 40 MG tablet  Commonly known as:  PROTONIX  Take 40 mg by mouth 2 (two) times daily.     predniSONE 20 MG tablet   Commonly known as:  DELTASONE  - pREDNISONE 40 MG DAILY for 3 days followed by  - Prednisone 20 mg daily for 3 days followed by  - Prednisone 10 mg daily for 3 days and stop.     sertraline 100 MG tablet  Commonly known as:  ZOLOFT  Take 100 mg by mouth daily.     warfarin 5 MG tablet  Commonly known as:  COUMADIN  Take 5 mg by mouth daily.       Allergies  Allergen Reactions  . Morphine And Related   . Sulfamethoxazole     REACTION: unspecified   Follow-up Information   Follow up with Donzetta SprungANIEL, TERRY, MD. Schedule an appointment as soon as possible for a visit in 1 week.   Specialty:  Family Medicine   Contact information:   [address] [place] [id number] [phone]        The results of significant diagnostics from this hospitalization (including imaging, microbiology, ancillary and laboratory) are listed below for reference.    Significant Diagnostic Studies: Dg Chest Port 1 View  04/26/2013   CLINICAL DATA:  Cough, wheezing.  EXAM: PORTABLE CHEST - 1 VIEW  COMPARISON:  August 07, 2010.  FINDINGS: Stable cardiomediastinal silhouette. No pleural effusion or pneumothorax is noted. No acute pulmonary disease is noted. Bony thorax is intact.  IMPRESSION: No acute cardiopulmonary abnormality seen.   Electronically Signed   By: Roque LiasJames  Green M.D.   On: 04/26/2013 15:50    Microbiology: Recent Results (from the past 240 hour(s))  CLOSTRIDIUM DIFFICILE BY PCR     Status: None   Collection Time    04/26/13  5:18 PM      Result Value Range Status   C difficile by pcr NEGATIVE  NEGATIVE Final     Labs: Basic Metabolic Panel:  Recent Labs Lab 05/02/13 0642  NA 141  K 3.7  CL 102  CO2 29  GLUCOSE 125*  BUN 4*  CREATININE 0.99  CALCIUM 8.5   Liver Function Tests: No results found for this basename: AST, ALT, ALKPHOS, BILITOT, PROT, ALBUMIN,  in the last 168 hours No results found for this basename: LIPASE, AMYLASE,  in the last 168 hours No results found  for this basename: AMMONIA,  in the last 168 hours CBC:  Recent Labs Lab 05/02/13 0642  WBC 3.6*  HGB 9.9*  HCT 29.5*  MCV 86.5  PLT 167   Cardiac Enzymes: No results found for this basename: CKTOTAL, CKMB, CKMBINDEX, TROPONINI,  in the last 168 hours BNP: BNP (last 3 results) No results found for  this basename: PROBNP,  in the last 8760 hours CBG:  Recent Labs Lab 04/28/13 2156  GLUCAP 97       Signed:  Luca Dyar  [facility] 05/05/2013, 10:30 AM

## 2014-01-27 IMAGING — CR DG CHEST 1V PORT
1 series · 1 of 1 positions shown · non-contrast
Comparison: August 07, 2010.

CLINICAL DATA: Cough, wheezing.

EXAM:
PORTABLE CHEST - 1 VIEW

[ap]
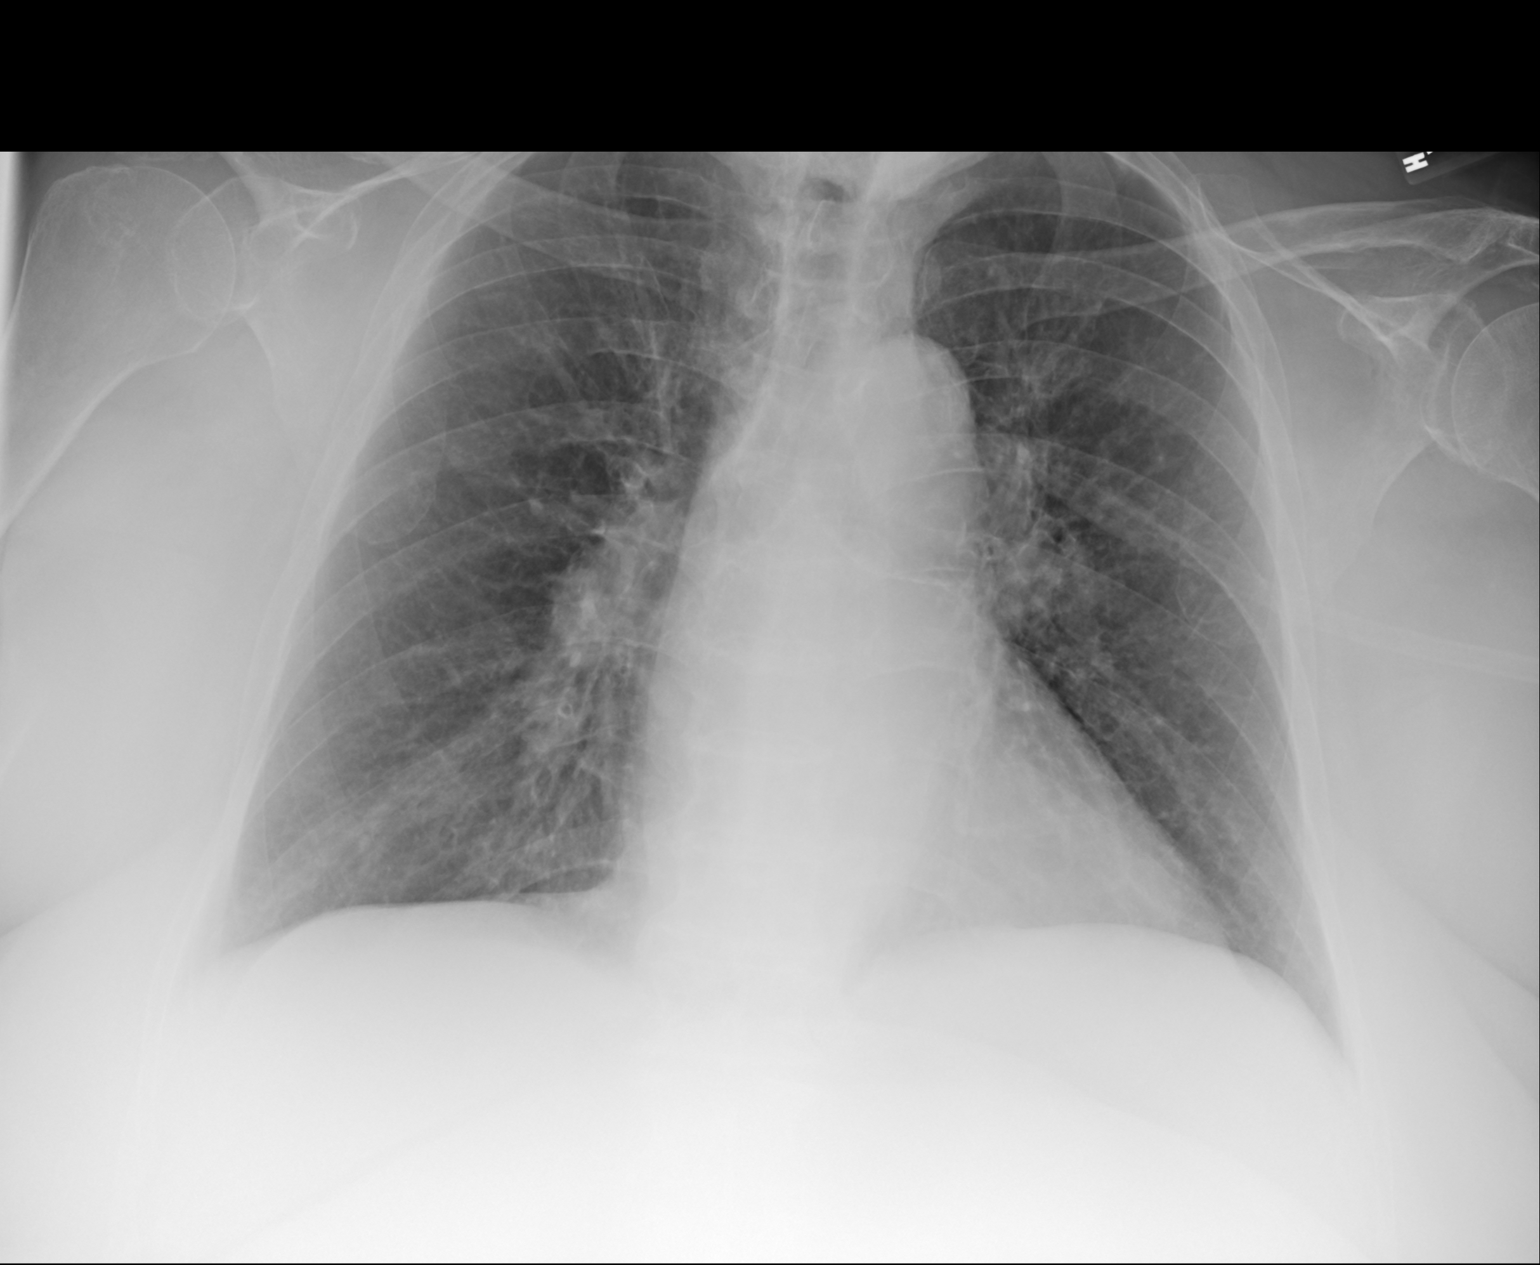

[1 of 1 positions shown; findings below may reference images not displayed]

FINDINGS: Stable cardiomediastinal silhouette. No pleural effusion or
pneumothorax is noted. No acute pulmonary disease is noted. Bony
thorax is intact.
IMPRESSION: No acute cardiopulmonary abnormality seen.

## 2015-07-12 DEATH — deceased
